# Patient Record
Sex: Female | Born: 1980 | Race: Black or African American | Hispanic: No | Marital: Married | Smoking: Never smoker
Health system: Southern US, Community
[De-identification: ages and names within clinical notes are randomized; demographics above are authoritative.]

## PROBLEM LIST (undated history)

## (undated) ENCOUNTER — Ambulatory Visit (HOSPITAL_COMMUNITY): Discharge status: Absent without leave | Payer: Commercial Managed Care - PPO

## (undated) DIAGNOSIS — O3429 Maternal care due to uterine scar from other previous surgery: Secondary | ICD-10-CM

## (undated) DIAGNOSIS — O09519 Supervision of elderly primigravida, unspecified trimester: Secondary | ICD-10-CM

## (undated) DIAGNOSIS — Z8619 Personal history of other infectious and parasitic diseases: Secondary | ICD-10-CM

## (undated) DIAGNOSIS — D219 Benign neoplasm of connective and other soft tissue, unspecified: Secondary | ICD-10-CM

## (undated) DIAGNOSIS — E669 Obesity, unspecified: Secondary | ICD-10-CM

## (undated) DIAGNOSIS — R87629 Unspecified abnormal cytological findings in specimens from vagina: Secondary | ICD-10-CM

## (undated) HISTORY — PX: MYOMECTOMY: SHX85

## (undated) HISTORY — PX: POLYPECTOMY: SHX149

## (undated) HISTORY — PX: LAPAROSCOPY: SHX197

## (undated) HISTORY — PX: TONSILLECTOMY: SUR1361

## (undated) HISTORY — PX: WISDOM TOOTH EXTRACTION: SHX21

## (undated) HISTORY — PX: CRYOTHERAPY: SHX1416

---

## 2015-10-13 LAB — OB RESULTS CONSOLE GC/CHLAMYDIA
CHLAMYDIA, DNA PROBE: NEGATIVE
Gonorrhea: NEGATIVE

## 2015-10-13 LAB — OB RESULTS CONSOLE ABO/RH: RH TYPE: POSITIVE

## 2015-10-13 LAB — OB RESULTS CONSOLE RPR: RPR: NONREACTIVE

## 2015-10-13 LAB — OB RESULTS CONSOLE ANTIBODY SCREEN: ANTIBODY SCREEN: NEGATIVE

## 2015-10-13 LAB — OB RESULTS CONSOLE HEPATITIS B SURFACE ANTIGEN: HEP B S AG: NEGATIVE

## 2015-10-13 LAB — OB RESULTS CONSOLE HIV ANTIBODY (ROUTINE TESTING): HIV: NONREACTIVE

## 2015-10-13 LAB — OB RESULTS CONSOLE RUBELLA ANTIBODY, IGM: Rubella: IMMUNE

## 2015-10-28 DIAGNOSIS — Z0289 Encounter for other administrative examinations: Secondary | ICD-10-CM

## 2016-03-07 ENCOUNTER — Other Ambulatory Visit: Payer: Self-pay | Admitting: Obstetrics and Gynecology

## 2016-04-07 ENCOUNTER — Encounter (HOSPITAL_COMMUNITY): Payer: Self-pay

## 2016-04-07 ENCOUNTER — Telehealth (HOSPITAL_COMMUNITY): Payer: Self-pay | Admitting: *Deleted

## 2016-04-07 NOTE — Telephone Encounter (Signed)
Preadmission screen  

## 2016-04-08 ENCOUNTER — Inpatient Hospital Stay (HOSPITAL_COMMUNITY)
Admission: AD | Admit: 2016-04-08 | Discharge: 2016-04-11 | DRG: 765 | Disposition: A | Discharge status: Home / Self Care | Payer: No Typology Code available for payment source | Source: Ambulatory Visit | Attending: Obstetrics and Gynecology | Admitting: Obstetrics and Gynecology

## 2016-04-08 ENCOUNTER — Encounter (HOSPITAL_COMMUNITY): Payer: Self-pay | Admitting: *Deleted

## 2016-04-08 DIAGNOSIS — O3429 Maternal care due to uterine scar from other previous surgery: Secondary | ICD-10-CM | POA: Diagnosis present

## 2016-04-08 DIAGNOSIS — O3493 Maternal care for abnormality of pelvic organ, unspecified, third trimester: Principal | ICD-10-CM | POA: Diagnosis present

## 2016-04-08 DIAGNOSIS — O9081 Anemia of the puerperium: Secondary | ICD-10-CM | POA: Diagnosis not present

## 2016-04-08 DIAGNOSIS — O3413 Maternal care for benign tumor of corpus uteri, third trimester: Secondary | ICD-10-CM | POA: Diagnosis present

## 2016-04-08 DIAGNOSIS — D250 Submucous leiomyoma of uterus: Secondary | ICD-10-CM | POA: Diagnosis present

## 2016-04-08 DIAGNOSIS — D62 Acute posthemorrhagic anemia: Secondary | ICD-10-CM | POA: Diagnosis not present

## 2016-04-08 DIAGNOSIS — Z3A36 36 weeks gestation of pregnancy: Secondary | ICD-10-CM

## 2016-04-08 HISTORY — DX: Maternal care due to uterine scar from other previous surgery: O34.29

## 2016-04-08 MED ORDER — CITRIC ACID-SODIUM CITRATE 334-500 MG/5ML PO SOLN
30.0000 mL | Freq: Once | ORAL | Status: AC
  Administered 2016-04-09: 30 mL via ORAL
  Filled 2016-04-08: qty 15

## 2016-04-08 MED ORDER — FAMOTIDINE IN NACL 20-0.9 MG/50ML-% IV SOLN
20.0000 mg | Freq: Once | INTRAVENOUS | Status: AC
  Administered 2016-04-09: 20 mg via INTRAVENOUS
  Filled 2016-04-08: qty 50

## 2016-04-08 MED ORDER — LACTATED RINGERS IV SOLN
INTRAVENOUS | Status: DC
  Administered 2016-04-09 (×3): via INTRAVENOUS

## 2016-04-08 MED ORDER — LACTATED RINGERS IV BOLUS (SEPSIS): 1000.0000 mL | Freq: Once | INTRAVENOUS | Status: DC

## 2016-04-08 NOTE — MAU Note (Signed)
Contractions denies SROM or bleeding

## 2016-04-08 NOTE — MAU Note (Signed)
PT   SAYS HURTING   SINCE 7PM.    THIS  PREG  IS  [place]  C/S ON 5-22    .   VE IN   OFFICE   CLOSED.

## 2016-04-09 ENCOUNTER — Inpatient Hospital Stay (HOSPITAL_COMMUNITY): Payer: No Typology Code available for payment source | Admitting: Anesthesiology

## 2016-04-09 ENCOUNTER — Encounter (HOSPITAL_COMMUNITY): Payer: Self-pay | Admitting: Obstetrics and Gynecology

## 2016-04-09 ENCOUNTER — Encounter (HOSPITAL_COMMUNITY)
Admission: AD | Disposition: A | Discharge status: Home / Self Care | Payer: Self-pay | Source: Ambulatory Visit | Attending: Obstetrics and Gynecology

## 2016-04-09 DIAGNOSIS — D250 Submucous leiomyoma of uterus: Secondary | ICD-10-CM | POA: Diagnosis present

## 2016-04-09 DIAGNOSIS — O3413 Maternal care for benign tumor of corpus uteri, third trimester: Secondary | ICD-10-CM | POA: Diagnosis present

## 2016-04-09 DIAGNOSIS — O3493 Maternal care for abnormality of pelvic organ, unspecified, third trimester: Secondary | ICD-10-CM | POA: Diagnosis present

## 2016-04-09 DIAGNOSIS — Z3A36 36 weeks gestation of pregnancy: Secondary | ICD-10-CM | POA: Diagnosis not present

## 2016-04-09 DIAGNOSIS — O9081 Anemia of the puerperium: Secondary | ICD-10-CM | POA: Diagnosis not present

## 2016-04-09 DIAGNOSIS — O3429 Maternal care due to uterine scar from other previous surgery: Secondary | ICD-10-CM

## 2016-04-09 DIAGNOSIS — D62 Acute posthemorrhagic anemia: Secondary | ICD-10-CM | POA: Diagnosis not present

## 2016-04-09 HISTORY — DX: Maternal care due to uterine scar from other previous surgery: O34.29

## 2016-04-09 LAB — CBC
HEMATOCRIT: 28.2 % — AB (ref 36.0–46.0)
HEMATOCRIT: 34.5 % — AB (ref 36.0–46.0)
HEMOGLOBIN: 11.9 g/dL — AB (ref 12.0–15.0)
Hemoglobin: 9.7 g/dL — ABNORMAL LOW (ref 12.0–15.0)
MCH: 32.1 pg (ref 26.0–34.0)
MCH: 32.1 pg (ref 26.0–34.0)
MCHC: 34.4 g/dL (ref 30.0–36.0)
MCHC: 34.5 g/dL (ref 30.0–36.0)
MCV: 93 fL (ref 78.0–100.0)
MCV: 93.4 fL (ref 78.0–100.0)
PLATELETS: 284 10*3/uL (ref 150–400)
Platelets: 345 10*3/uL (ref 150–400)
RBC: 3.02 MIL/uL — ABNORMAL LOW (ref 3.87–5.11)
RBC: 3.71 MIL/uL — ABNORMAL LOW (ref 3.87–5.11)
RDW: 15.1 % (ref 11.5–15.5)
RDW: 15.2 % (ref 11.5–15.5)
WBC: 11.8 10*3/uL — ABNORMAL HIGH (ref 4.0–10.5)
WBC: 15.5 10*3/uL — AB (ref 4.0–10.5)

## 2016-04-09 LAB — RPR: RPR Ser Ql: NONREACTIVE

## 2016-04-09 LAB — TYPE AND SCREEN
ABO/RH(D): O POS
Antibody Screen: NEGATIVE

## 2016-04-09 LAB — ABO/RH: ABO/RH(D): O POS

## 2016-04-09 SURGERY — Surgical Case
Anesthesia: Spinal

## 2016-04-09 MED ORDER — OXYTOCIN 10 UNIT/ML IJ SOLN
INTRAMUSCULAR | Status: AC
  Filled 2016-04-09: qty 4

## 2016-04-09 MED ORDER — PHENYLEPHRINE 40 MCG/ML (10ML) SYRINGE FOR IV PUSH (FOR BLOOD PRESSURE SUPPORT)
PREFILLED_SYRINGE | INTRAVENOUS | Status: AC
  Filled 2016-04-09: qty 10

## 2016-04-09 MED ORDER — METHYLERGONOVINE MALEATE 0.2 MG PO TABS: 0.2000 mg | ORAL_TABLET | ORAL | Status: DC | PRN

## 2016-04-09 MED ORDER — NALBUPHINE HCL 10 MG/ML IJ SOLN: 5.0000 mg | INTRAMUSCULAR | Status: DC | PRN

## 2016-04-09 MED ORDER — COCONUT OIL OIL
1.0000 "application " | TOPICAL_OIL | Status: DC | PRN
  Filled 2016-04-09: qty 120

## 2016-04-09 MED ORDER — ONDANSETRON HCL 4 MG/2ML IJ SOLN
INTRAMUSCULAR | Status: AC
  Filled 2016-04-09: qty 2

## 2016-04-09 MED ORDER — SODIUM CHLORIDE 0.9 % IV SOLN: 250.0000 mL | INTRAVENOUS | Status: DC

## 2016-04-09 MED ORDER — SODIUM CHLORIDE 0.9% FLUSH: 3.0000 mL | Freq: Two times a day (BID) | INTRAVENOUS | Status: DC

## 2016-04-09 MED ORDER — KETOROLAC TROMETHAMINE 30 MG/ML IJ SOLN
30.0000 mg | Freq: Four times a day (QID) | INTRAMUSCULAR | Status: DC | PRN
  Administered 2016-04-09: 30 mg via INTRAVENOUS

## 2016-04-09 MED ORDER — NALOXONE HCL 2 MG/2ML IJ SOSY
1.0000 ug/kg/h | PREFILLED_SYRINGE | INTRAMUSCULAR | Status: DC | PRN
Start: 2016-04-09 — End: 2016-04-10
  Filled 2016-04-09: qty 2

## 2016-04-09 MED ORDER — SODIUM CHLORIDE 0.9% FLUSH: 3.0000 mL | INTRAVENOUS | Status: DC | PRN

## 2016-04-09 MED ORDER — LACTATED RINGERS IV SOLN
INTRAVENOUS | Status: DC
  Administered 2016-04-09: 12:00:00 via INTRAVENOUS

## 2016-04-09 MED ORDER — ONDANSETRON HCL 4 MG/2ML IJ SOLN: 4.0000 mg | Freq: Three times a day (TID) | INTRAMUSCULAR | Status: DC | PRN

## 2016-04-09 MED ORDER — OXYTOCIN 10 UNIT/ML IJ SOLN
40.0000 [IU] | INTRAMUSCULAR | Status: DC | PRN
  Administered 2016-04-09: 40 [IU] via INTRAVENOUS

## 2016-04-09 MED ORDER — MORPHINE SULFATE (PF) 0.5 MG/ML IJ SOLN
INTRAMUSCULAR | Status: AC
  Filled 2016-04-09: qty 10

## 2016-04-09 MED ORDER — ZOLPIDEM TARTRATE 5 MG PO TABS: 5.0000 mg | ORAL_TABLET | Freq: Every evening | ORAL | Status: DC | PRN

## 2016-04-09 MED ORDER — BUPIVACAINE HCL (PF) 0.25 % IJ SOLN
INTRAMUSCULAR | Status: DC | PRN
  Administered 2016-04-09: 30 mL

## 2016-04-09 MED ORDER — DIBUCAINE 1 % RE OINT
1.0000 "application " | TOPICAL_OINTMENT | RECTAL | Status: DC | PRN
  Filled 2016-04-09: qty 56.7

## 2016-04-09 MED ORDER — OXYCODONE HCL 5 MG PO TABS: 10.0000 mg | ORAL_TABLET | ORAL | Status: DC | PRN

## 2016-04-09 MED ORDER — MEPERIDINE HCL 25 MG/ML IJ SOLN
INTRAMUSCULAR | Status: AC
Start: 2016-04-09 — End: 2016-04-09
  Filled 2016-04-09: qty 1

## 2016-04-09 MED ORDER — FENTANYL CITRATE (PF) 100 MCG/2ML IJ SOLN: 25.0000 ug | INTRAMUSCULAR | Status: DC | PRN

## 2016-04-09 MED ORDER — NALBUPHINE HCL 10 MG/ML IJ SOLN: 5.0000 mg | Freq: Once | INTRAMUSCULAR | Status: DC | PRN

## 2016-04-09 MED ORDER — LACTATED RINGERS IV SOLN: INTRAVENOUS | Status: DC

## 2016-04-09 MED ORDER — DIPHENHYDRAMINE HCL 50 MG/ML IJ SOLN
INTRAMUSCULAR | Status: DC | PRN
  Administered 2016-04-09: 25 mg via INTRAVENOUS

## 2016-04-09 MED ORDER — METOCLOPRAMIDE HCL 5 MG/ML IJ SOLN
INTRAMUSCULAR | Status: AC
  Administered 2016-04-09: 10 mg via INTRAVENOUS
  Filled 2016-04-09: qty 2

## 2016-04-09 MED ORDER — OXYCODONE HCL 5 MG PO TABS
5.0000 mg | ORAL_TABLET | ORAL | Status: DC | PRN
  Administered 2016-04-10 (×3): 5 mg via ORAL
  Filled 2016-04-09 (×3): qty 1

## 2016-04-09 MED ORDER — CEFAZOLIN SODIUM-DEXTROSE 2-4 GM/100ML-% IV SOLN
2.0000 g | INTRAVENOUS | Status: AC
  Administered 2016-04-09: 2 g via INTRAVENOUS
  Filled 2016-04-09: qty 100

## 2016-04-09 MED ORDER — FERROUS SULFATE 325 (65 FE) MG PO TABS
325.0000 mg | ORAL_TABLET | Freq: Two times a day (BID) | ORAL | Status: DC
  Administered 2016-04-09 – 2016-04-11 (×5): 325 mg via ORAL
  Filled 2016-04-09 (×6): qty 1

## 2016-04-09 MED ORDER — PHENYLEPHRINE 8 MG IN D5W 100 ML (0.08MG/ML) PREMIX OPTIME
INJECTION | INTRAVENOUS | Status: AC
  Filled 2016-04-09: qty 100

## 2016-04-09 MED ORDER — KETOROLAC TROMETHAMINE 30 MG/ML IJ SOLN
INTRAMUSCULAR | Status: AC
  Filled 2016-04-09: qty 1

## 2016-04-09 MED ORDER — KETOROLAC TROMETHAMINE 30 MG/ML IJ SOLN: 30.0000 mg | Freq: Four times a day (QID) | INTRAMUSCULAR | Status: DC | PRN

## 2016-04-09 MED ORDER — SCOPOLAMINE 1 MG/3DAYS TD PT72
MEDICATED_PATCH | TRANSDERMAL | Status: DC | PRN
  Administered 2016-04-09: 1 via TRANSDERMAL

## 2016-04-09 MED ORDER — MEPERIDINE HCL 25 MG/ML IJ SOLN
INTRAMUSCULAR | Status: DC | PRN
  Administered 2016-04-09 (×2): 12.5 mg via INTRAVENOUS

## 2016-04-09 MED ORDER — NALOXONE HCL 0.4 MG/ML IJ SOLN: 0.4000 mg | INTRAMUSCULAR | Status: DC | PRN

## 2016-04-09 MED ORDER — DIPHENHYDRAMINE HCL 25 MG PO CAPS
25.0000 mg | ORAL_CAPSULE | ORAL | Status: DC | PRN
  Filled 2016-04-09: qty 1

## 2016-04-09 MED ORDER — METOCLOPRAMIDE HCL 5 MG/ML IJ SOLN: 10.0000 mg | Freq: Once | INTRAMUSCULAR | Status: DC | PRN

## 2016-04-09 MED ORDER — SIMETHICONE 80 MG PO CHEW
80.0000 mg | CHEWABLE_TABLET | ORAL | Status: DC | PRN
  Filled 2016-04-09: qty 1

## 2016-04-09 MED ORDER — SIMETHICONE 80 MG PO CHEW
80.0000 mg | CHEWABLE_TABLET | ORAL | Status: DC
  Administered 2016-04-10 (×2): 80 mg via ORAL
  Filled 2016-04-09 (×2): qty 1

## 2016-04-09 MED ORDER — MEPERIDINE HCL 25 MG/ML IJ SOLN: 6.2500 mg | INTRAMUSCULAR | Status: DC | PRN

## 2016-04-09 MED ORDER — MORPHINE SULFATE (PF) 0.5 MG/ML IJ SOLN
INTRAMUSCULAR | Status: DC | PRN
  Administered 2016-04-09: .1 mg via INTRATHECAL

## 2016-04-09 MED ORDER — PHENYLEPHRINE 8 MG IN D5W 100 ML (0.08MG/ML) PREMIX OPTIME
INJECTION | INTRAVENOUS | Status: DC | PRN
  Administered 2016-04-09: 40 ug/min via INTRAVENOUS

## 2016-04-09 MED ORDER — BUPIVACAINE IN DEXTROSE 0.75-8.25 % IT SOLN
INTRATHECAL | Status: DC | PRN
  Administered 2016-04-09: 1.4 mL via INTRATHECAL

## 2016-04-09 MED ORDER — SCOPOLAMINE 1 MG/3DAYS TD PT72
MEDICATED_PATCH | TRANSDERMAL | Status: AC
  Filled 2016-04-09: qty 1

## 2016-04-09 MED ORDER — METHYLERGONOVINE MALEATE 0.2 MG/ML IJ SOLN: 0.2000 mg | INTRAMUSCULAR | Status: DC | PRN

## 2016-04-09 MED ORDER — BISACODYL 10 MG RE SUPP
10.0000 mg | Freq: Every day | RECTAL | Status: DC | PRN
Start: 2016-04-09 — End: 2016-04-11
  Filled 2016-04-09: qty 1

## 2016-04-09 MED ORDER — ONDANSETRON HCL 4 MG/2ML IJ SOLN
INTRAMUSCULAR | Status: DC | PRN
  Administered 2016-04-09: 4 mg via INTRAVENOUS

## 2016-04-09 MED ORDER — SCOPOLAMINE 1 MG/3DAYS TD PT72: 1.0000 | MEDICATED_PATCH | Freq: Once | TRANSDERMAL | Status: DC

## 2016-04-09 MED ORDER — OXYTOCIN 40 UNITS IN LACTATED RINGERS INFUSION - SIMPLE MED: 2.5000 [IU]/h | INTRAVENOUS | Status: AC

## 2016-04-09 MED ORDER — TRIAMCINOLONE ACETONIDE 40 MG/ML IJ SUSP: 40.0000 mg | Freq: Once | INTRAMUSCULAR | Status: DC

## 2016-04-09 MED ORDER — FLEET ENEMA 7-19 GM/118ML RE ENEM: 1.0000 | ENEMA | Freq: Every day | RECTAL | Status: DC | PRN

## 2016-04-09 MED ORDER — SIMETHICONE 80 MG PO CHEW
80.0000 mg | CHEWABLE_TABLET | Freq: Three times a day (TID) | ORAL | Status: DC
  Administered 2016-04-09 – 2016-04-11 (×8): 80 mg via ORAL
  Filled 2016-04-09 (×10): qty 1

## 2016-04-09 MED ORDER — SILVER NITRATE-POT NITRATE 75-25 % EX MISC
CUTANEOUS | Status: AC
  Filled 2016-04-09: qty 1

## 2016-04-09 MED ORDER — TRIAMCINOLONE ACETONIDE 40 MG/ML IJ SUSP
Freq: Once | INTRAMUSCULAR | Status: DC
  Filled 2016-04-09 (×2): qty 1

## 2016-04-09 MED ORDER — PRENATAL MULTIVITAMIN CH
1.0000 | ORAL_TABLET | Freq: Every day | ORAL | Status: DC
  Administered 2016-04-09 – 2016-04-11 (×3): 1 via ORAL
  Filled 2016-04-09 (×4): qty 1

## 2016-04-09 MED ORDER — PHENYLEPHRINE HCL 10 MG/ML IJ SOLN
INTRAMUSCULAR | Status: DC | PRN
  Administered 2016-04-09 (×4): 80 ug via INTRAVENOUS

## 2016-04-09 MED ORDER — DIPHENHYDRAMINE HCL 50 MG/ML IJ SOLN: 12.5000 mg | INTRAMUSCULAR | Status: DC | PRN

## 2016-04-09 MED ORDER — DEXAMETHASONE SODIUM PHOSPHATE 4 MG/ML IJ SOLN
INTRAMUSCULAR | Status: AC
  Filled 2016-04-09: qty 1

## 2016-04-09 MED ORDER — DIPHENHYDRAMINE HCL 25 MG PO CAPS
25.0000 mg | ORAL_CAPSULE | Freq: Four times a day (QID) | ORAL | Status: DC | PRN
  Filled 2016-04-09: qty 1

## 2016-04-09 MED ORDER — WITCH HAZEL-GLYCERIN EX PADS: 1.0000 "application " | MEDICATED_PAD | CUTANEOUS | Status: DC | PRN

## 2016-04-09 MED ORDER — SENNOSIDES-DOCUSATE SODIUM 8.6-50 MG PO TABS
2.0000 | ORAL_TABLET | ORAL | Status: DC
  Administered 2016-04-10 (×2): 2 via ORAL
  Filled 2016-04-09 (×2): qty 2

## 2016-04-09 MED ORDER — FENTANYL CITRATE (PF) 100 MCG/2ML IJ SOLN
INTRAMUSCULAR | Status: DC | PRN
  Administered 2016-04-09: 12.5 ug via INTRATHECAL
  Administered 2016-04-09: 40 ug via INTRAVENOUS

## 2016-04-09 MED ORDER — FENTANYL CITRATE (PF) 100 MCG/2ML IJ SOLN
INTRAMUSCULAR | Status: AC
  Filled 2016-04-09: qty 2

## 2016-04-09 MED ORDER — MENTHOL 3 MG MT LOZG
1.0000 | LOZENGE | OROMUCOSAL | Status: DC | PRN
  Filled 2016-04-09: qty 9

## 2016-04-09 MED ORDER — IBUPROFEN 600 MG PO TABS
600.0000 mg | ORAL_TABLET | Freq: Four times a day (QID) | ORAL | Status: DC
  Administered 2016-04-09 – 2016-04-11 (×9): 600 mg via ORAL
  Filled 2016-04-09 (×9): qty 1

## 2016-04-09 SURGICAL SUPPLY — 43 items
BARRIER ADHS 3X4 INTERCEED (GAUZE/BANDAGES/DRESSINGS) ×3 IMPLANT
BENZOIN TINCTURE PRP APPL 2/3 (GAUZE/BANDAGES/DRESSINGS) IMPLANT
CLAMP CORD UMBIL (MISCELLANEOUS) IMPLANT
CLOSURE WOUND 1/2 X4 (GAUZE/BANDAGES/DRESSINGS)
CLOTH BEACON ORANGE TIMEOUT ST (SAFETY) ×3 IMPLANT
CONTAINER PREFILL 10% NBF 15ML (MISCELLANEOUS) IMPLANT
DRAPE C SECTION CLR SCREEN (DRAPES) ×3 IMPLANT
DRSG OPSITE POSTOP 4X10 (GAUZE/BANDAGES/DRESSINGS) ×3 IMPLANT
DURAPREP 26ML APPLICATOR (WOUND CARE) ×3 IMPLANT
ELECT REM PT RETURN 9FT ADLT (ELECTROSURGICAL) ×3
ELECTRODE REM PT RTRN 9FT ADLT (ELECTROSURGICAL) ×1 IMPLANT
EXTRACTOR VACUUM M CUP 4 TUBE (SUCTIONS) IMPLANT
EXTRACTOR VACUUM M CUP 4' TUBE (SUCTIONS)
GLOVE BIOGEL PI IND STRL 7.0 (GLOVE) ×2 IMPLANT
GLOVE BIOGEL PI INDICATOR 7.0 (GLOVE) ×4
GLOVE ECLIPSE 6.5 STRL STRAW (GLOVE) ×3 IMPLANT
GOWN STRL REUS W/TWL LRG LVL3 (GOWN DISPOSABLE) ×6 IMPLANT
KIT ABG SYR 3ML LUER SLIP (SYRINGE) IMPLANT
NEEDLE HYPO 22GX1.5 SAFETY (NEEDLE) ×3 IMPLANT
NEEDLE HYPO 25X5/8 SAFETYGLIDE (NEEDLE) IMPLANT
NS IRRIG 1000ML POUR BTL (IV SOLUTION) ×3 IMPLANT
PACK C SECTION WH (CUSTOM PROCEDURE TRAY) ×3 IMPLANT
PAD OB MATERNITY 4.3X12.25 (PERSONAL CARE ITEMS) ×3 IMPLANT
RTRCTR C-SECT PINK 25CM LRG (MISCELLANEOUS) IMPLANT
STRIP CLOSURE SKIN 1/2X4 (GAUZE/BANDAGES/DRESSINGS) IMPLANT
SUT CHROMIC GUT AB #0 18 (SUTURE) IMPLANT
SUT MNCRL 0 VIOLET CTX 36 (SUTURE) ×3 IMPLANT
SUT MON AB 2-0 SH 27 (SUTURE)
SUT MON AB 2-0 SH27 (SUTURE) IMPLANT
SUT MON AB 3-0 SH 27 (SUTURE)
SUT MON AB 3-0 SH27 (SUTURE) IMPLANT
SUT MON AB 4-0 PS1 27 (SUTURE) IMPLANT
SUT MONOCRYL 0 CTX 36 (SUTURE) ×6
SUT PLAIN 2 0 (SUTURE)
SUT PLAIN 2 0 XLH (SUTURE) IMPLANT
SUT PLAIN ABS 2-0 CT1 27XMFL (SUTURE) IMPLANT
SUT VIC AB 0 CT1 36 (SUTURE) ×6 IMPLANT
SUT VIC AB 2-0 CT1 27 (SUTURE) ×2
SUT VIC AB 2-0 CT1 TAPERPNT 27 (SUTURE) ×1 IMPLANT
SUT VIC AB 4-0 PS2 27 (SUTURE) IMPLANT
SYR CONTROL 10ML LL (SYRINGE) ×3 IMPLANT
TOWEL OR 17X24 6PK STRL BLUE (TOWEL DISPOSABLE) ×3 IMPLANT
TRAY FOLEY CATH SILVER 14FR (SET/KITS/TRAYS/PACK) IMPLANT

## 2016-04-09 NOTE — Op Note (Signed)
[name], [name]               ACCOUNT NO.:  [id number]  MEDICAL RECORD NO.:  [id number]  LOCATION:  [facility]                          FACILITY:  [facility]  PHYSICIAN:  Servando Salina, M.D.DATE OF BIRTH:  [birth year]  DATE OF PROCEDURE:  04/09/2016 DATE OF DISCHARGE:                              OPERATIVE REPORT   PREOPERATIVE DIAGNOSES:  Labor, previous myomectomy, intrauterine gestation at 36-1/7th weeks.  PROCEDURES:  Primary cesarean section, Kerr hysterotomy, myomectomy.  POSTOPERATIVE DIAGNOSES:  Labor, previous myomectomy, intrauterine gestation at 36-1/7th weeks, submucosal fibroid, fibroid uterus.  ANESTHESIA:  Spinal.  SURGEON:  Servando Salina, M.D.  ASSISTANT:  Hester Mates, CNM  DESCRIPTION OF PROCEDURE:  Under adequate spinal anesthesia, the patient was placed in the supine position with a left lateral tilt.  She was sterilely prepped and draped in usual fashion.  Indwelling Foley catheter was sterilely placed.  Marcaine 0.25% was injected along the previous Pfannenstiel skin incision.  The patient had a keloid formation in the midportion of her prior incision.  A Pfannenstiel skin incision was then made through the previous scar, carried down to the rectus fascia.  Rectus fascia was opened transversely.  The rectus fascia was then bluntly and sharply dissected off the rectus muscle in a superior and inferior fashion.  The rectus muscle was split in the midline.  The parietal peritoneum was entered sharply and extended.  The fibroid uterus was encountered.  Well-developed lower uterine segment was encountered.  A self-retaining Alexis retractor was then placed.  A vesicouterine peritoneum was opened transversely and extended bilaterally.  Bladder was gently dissected off the lower uterine segment.  Curvilinear low transverse uterine incision was then made and extended with bandage scissors.  Artificial rupture of membranes then was done, copious fluid was  noted, and the baby's right shoulder was coming to an incision, both of those items were reduced.  Subsequent delivery of a live female from the left occiput anterior position wedged in the pelvis was accomplished.  Cord around the neck x2 was noted which was reducible.  Baby was bulb suctioned on the abdomen.  Cord was clamped, cut.  The baby was transferred to the awaiting pediatrician who assigned Apgars 8 and 9 at 1 and 5 minutes.  The placenta was posterior and fundal.  The manual removal was accomplished with some evidence of may be placental separation being present.  The cavity was then cleaned of debris.  On manual exploration of the cavity and visual exploration of the cavity, posterior submucosal fibroid was noted and was removed.  A left fundal submucosal fibroid in the cornual region was palpated, but not removed due to its location.  Uterine cavity was cleaned of debris.  Brisk bleeding was noted from vessels on the left angle of the incision which was then clamped.  The incision had a small extension on the right inferior aspect, which was closed separately with 0 Monocryl running locked stitch, imbricated with some 0 Monocryl interrupted sutures.  The incision was closed with 0 Monocryl running locked stitch first layer.  There was bleeding and that corner required about 2 additional figure-of-eight sutures for hemostasis and then the second layer was imbricated with 0 Monocryl  suture was then performed. Good hemostasis therefore at that point achieved.  Clear urine was noted.  The left tube has some paratubal cysts which are not removed. Both ovaries were normal.  The right tube was normal.  There was evidence of a left fundal fibroid which was the larger one. Other intramural anterior, small pedunculated fibroids and serosal fibroids were noted.  The abdomen was irrigated and suctioned of debris.  The incision had good hemostasis.  Interceed was placed overlying the  incision in an inverted T fashion.  The Alexis retractor was then removed.  The parietal peritoneum was then closed with 2-0 Vicryl.  The rectus fascia was closed with 0 Vicryl x2.  The subcutaneous area was irrigated, small bleeders cauterized.  Kenalog with 0.5% Marcaine was then injected along that incision and the keloid area that had been severed in half was removed anteriorly and posterior for the incision.  Once that was done, the skin was approximated using 4-0 Vicryl subcuticular closure after 2- 0 plain sutures were placed in the subcutaneous area.  Dermabond was placed overlying the incision.  SPECIMEN:  Placenta and myoma sent to Pathology.  ESTIMATED BLOOD LOSS:  1500 ml.  URINE OUTPUT:  75 mL clear yellow urine.  INTRAOPERATIVE FLUID:  3 L.  COUNTS:  Sponge and instrument counts x2 were correct.  COMPLICATIONS:  None.  The patient tolerated the procedure well, was transferred to recovery in stable condition. Baby placed skin to skin.     Servando Salina, M.D.     Poth/MEDQ  D:  04/09/2016  T:  04/09/2016  Job:  [id number]

## 2016-04-09 NOTE — Anesthesia Preprocedure Evaluation (Signed)
Anesthesia Evaluation  Patient identified by MRN, date of birth, ID band Patient awake    Reviewed: Allergy & Precautions, H&P , NPO status , Patient's Chart, lab work & pertinent test results  History of Anesthesia Complications Negative for: history of anesthetic complications  Airway Mallampati: II  TM Distance: >3 FB Neck ROM: full    Dental no notable dental hx. (+) Teeth Intact   Pulmonary neg pulmonary ROS,    Pulmonary exam normal breath sounds clear to auscultation       Cardiovascular negative cardio ROS Normal cardiovascular exam Rhythm:regular Rate:Normal     Neuro/Psych negative neurological ROS  negative psych ROS   GI/Hepatic negative GI ROS, Neg liver ROS,   Endo/Other  negative endocrine ROS  Renal/GU negative Renal ROS  negative genitourinary   Musculoskeletal   Abdominal   Peds  Hematology negative hematology ROS (+)   Anesthesia Other Findings   Reproductive/Obstetrics (+) Pregnancy                             Anesthesia Physical Anesthesia Plan  ASA: II and emergent  Anesthesia Plan: Spinal   Post-op Pain Management:    Induction:   Airway Management Planned:   Additional Equipment:   Intra-op Plan:   Post-operative Plan:   Informed Consent: I have reviewed the patients History and Physical, chart, labs and discussed the procedure including the risks, benefits and alternatives for the proposed anesthesia with the patient or authorized representative who has indicated his/her understanding and acceptance.     Plan Discussed with:   Anesthesia Plan Comments:         Anesthesia Quick Evaluation

## 2016-04-09 NOTE — H&P (Signed)
[name] is a 35 y.o. female [id number] MBF  Previous myomectomy now @ 36 1/[redacted] weeks gestation  presenting in labor. Pt is scheduled for primary C/S @ 36 weeks. Intact membrane . (+) ctx. GBS cx done Friday. Seen in office Friday and was 1 cm/long/-3. NST was reactive with rare ctx  Maternal Medical History:  Reason for admission: Contractions.   Contractions: Onset was 3-5 hours ago.   Frequency: regular.   Perceived severity is moderate.    Fetal activity: Perceived fetal activity is normal.    Prenatal complications: Uterine fibroids  Prenatal Complications - Diabetes: none.    OB History    Gravida Para Term Preterm AB TAB SAB Ectopic Multiple Living   7 1   5  5         Past Medical History  Diagnosis Date  . Fibroid   . AMA (advanced maternal age) primigravida 14+   . Hx of varicella   . Obese    Past Surgical History  Procedure Laterality Date  . Myomectomy    . Polypectomy    . Tonsillectomy    . Cryotherapy    . Wisdom tooth extraction    . Laparoscopy     Family History: family history includes Cancer in her maternal grandfather and paternal grandfather; Crohn's disease in her brother; Diabetes in her maternal grandmother; GER disease in her mother; Hypertension in her brother, father, and mother; Uterine cancer in her paternal grandmother. Social History:  reports that she has never smoked. She does not have any smokeless tobacco history on file. She reports that she does not drink alcohol or use illicit drugs.   Prenatal Transfer Tool  Maternal Diabetes: No Genetic Screening: Declined Maternal Ultrasounds/Referrals: Abnormal:  Findings:   Isolated EIF (echogenic intracardiac focus) Fetal Ultrasounds or other Referrals:  None Maternal Substance Abuse:  No Significant Maternal Medications:  None Significant Maternal Lab Results:  Lab values include: Other: GBS cx pending Other Comments:  hx recurrent preg loss, fibroid uterus with prior myomectomy  Review  of Systems  All other systems reviewed and are negative.   Dilation: 3 Effacement (%): 90 Station: -2 Exam by:: [id number] Blood pressure 129/78, pulse 80, temperature 97.9 F (36.6 C), temperature source Oral, resp. rate 20, height 5\' 5"  (1.651 m), weight 92.534 kg (204 lb), last menstrual period 08/05/2015. Exam Physical Exam  Constitutional: She is oriented to person, place, and time. She appears well-developed and well-nourished.  HENT:  Head: Normocephalic.  Eyes: EOM are normal.  Neck: Normal range of motion.  Cardiovascular: Normal rate.   Respiratory: Effort normal.  GI: Soft.  Musculoskeletal: She exhibits edema.  Neurological: She is alert and oriented to person, place, and time.  Skin: Skin is warm and dry.  Psychiatric: She has a normal mood and affect.    Prenatal labs: ABO, Rh: O/Positive/-- (11/16 0000) Antibody: Negative (11/16 0000) Rubella: Immune (11/16 0000) RPR: Nonreactive (11/16 0000)  HBsAg: Negative (11/16 0000)  HIV: Non-reactive (11/16 0000)  GBS:   pending  CBC    Component Value Date/Time   WBC 11.8* 04/09/2016 2354   RBC 3.71* 04/09/2016 2354   HGB 11.9* 04/09/2016 2354   HCT 34.5* 04/09/2016 2354   PLT 345 04/09/2016 2354   MCV 93.0 04/09/2016 2354   MCH 32.1 04/09/2016 2354   MCHC 34.5 04/09/2016 2354   RDW 15.2 04/09/2016 2354     Assessment/Plan: Labor Previous myomectomy Fibroid uterus IUP @ 36 1/7 wks P) admit routine lab. Primary  C/S.  Risk of C/S reviewed including infection, bleeding, possible need for blood transfusion and its risk (HIV, hepatitis, acute rxn), internal scar tissue, injury to surrounding organs( ureter, bladder, bladder). All ? Answered. Due to risk of uterine rupture, pt cannot wait for 8 hours post last meal.  Gamble Enderle A 04/09/2016, 12:09 AM

## 2016-04-09 NOTE — Addendum Note (Signed)
Addendum  created 04/09/16 0757 by Ignacia Bayley, CRNA   Modules edited: Clinical Notes   Clinical Notes:  File: [id number]

## 2016-04-09 NOTE — Anesthesia Postprocedure Evaluation (Signed)
Anesthesia Post Note  Patient: [name]  Procedure(s) Performed: Procedure(s) (LRB): CESAREAN SECTION (N/A)  Patient location during evaluation: Mother Baby Anesthesia Type: Spinal Level of consciousness: awake Pain management: pain level controlled Vital Signs Assessment: post-procedure vital signs reviewed and stable Respiratory status: spontaneous breathing Cardiovascular status: stable Postop Assessment: no headache, no backache, spinal receding, patient able to bend at knees, no signs of nausea or vomiting and adequate PO intake Anesthetic complications: no     Last Vitals:  Filed Vitals:   04/09/16 0516 04/09/16 0628  BP: 109/70 112/67  Pulse: 79 70  Temp: 37.3 C 37 C  Resp: 18 18    Last Pain:  Filed Vitals:   04/09/16 0628  PainSc: 3    Pain Goal:                 [name]

## 2016-04-09 NOTE — Anesthesia Postprocedure Evaluation (Signed)
Anesthesia Post Note  Patient: [name]  Procedure(s) Performed: Procedure(s) (LRB): CESAREAN SECTION (N/A)  Patient location during evaluation: PACU Anesthesia Type: Spinal Level of consciousness: oriented and awake and alert Pain management: pain level controlled Vital Signs Assessment: post-procedure vital signs reviewed and stable Respiratory status: spontaneous breathing, respiratory function stable and patient connected to nasal cannula oxygen Cardiovascular status: blood pressure returned to baseline and stable Postop Assessment: no headache and no backache Anesthetic complications: no     Last Vitals:  Filed Vitals:   04/09/16 0430 04/09/16 0516  BP: 114/63 109/70  Pulse: 78 79  Temp: 36.8 C 37.3 C  Resp: 18 18    Last Pain:  Filed Vitals:   04/09/16 0516  PainSc: 3    Pain Goal:                 Montez Hageman

## 2016-04-09 NOTE — Brief Op Note (Signed)
04/08/2016 - 04/09/2016  2:32 AM  PATIENT:  [name]  35 y.o. female  PRE-OPERATIVE DIAGNOSIS:  Labor, previous myomectomy, IUP@36  1/7 weeks  POST-OPERATIVE DIAGNOSIS:  Labor, previous myomectomy, IUP@ 36 1/7 weeks, SM fibroid, Fibroid uterus  PROCEDURE:  Primary Cesarean, kerr hysterotomy. myomectomy  SURGEON:  Surgeon(s) and Role:    * Corporate treasurer, MD - Primary  PHYSICIAN ASSISTANT:   ASSISTANTS: Laury Deep, CNM   ANESTHESIA:   spinal Findings: fibroids( SM fibroid left fundal, IM fibroid, SS fibroid, nl tubes and ovaries, left small paratubal, small SM posterior  fibroid, live female LOA wedged. Apgar 8/9. Nl tubes and ovaries,  Nuchal cord x 2 placenta post ? abruption EBL:  Total I/O In: 3000 [I.V.:3000] Out: 1625 [Urine:75; Blood:1550]  BLOOD ADMINISTERED:none  DRAINS: none   LOCAL MEDICATIONS USED:  MARCAINE     SPECIMEN:  Source of Specimen:  placenta, myoma  DISPOSITION OF SPECIMEN:  PATHOLOGY  COUNTS:  YES  TOURNIQUET:  * No tourniquets in log *  DICTATION: .Dragon [phone]  PLAN OF CARE: Admit to inpatient   PATIENT DISPOSITION:  PACU - hemodynamically stable.   Delay start of Pharmacological VTE agent (>24hrs) due to surgical blood loss or risk of bleeding: no

## 2016-04-09 NOTE — OR Nursing (Signed)
Unable to document PACU times in events. In recovery- 0221 Recovery Care Complete- 0345 Out of Recovery -0345 Procedural Care Complete- 0345

## 2016-04-09 NOTE — Transfer of Care (Signed)
Immediate Anesthesia Transfer of Care Note  Patient: [name]  Procedure(s) Performed: Procedure(s): CESAREAN SECTION (N/A)  Patient Location: PACU  Anesthesia Type:Spinal  Level of Consciousness: awake, alert  and oriented  Airway & Oxygen Therapy: Patient Spontanous Breathing  Post-op Assessment: Report given to RN and Post -op Vital signs reviewed and stable  Post vital signs: Reviewed and stable  Last Vitals:  Filed Vitals:   04/08/16 2329  BP: 129/78  Pulse: 80  Temp: 36.6 C  Resp: 20    Last Pain: There were no vitals filed for this visit.       Complications: No apparent anesthesia complications

## 2016-04-09 NOTE — Lactation Note (Signed)
This note was copied from a baby's chart. Lactation Consultation Note  Patient Name: Boy Margart Jackson [id number] Date: 04/09/2016 Reason for consult: Initial assessment  With this mom of a LPI, now 35 hours old, and 36 1/[redacted] weeks gestation, weighing 4 lbs 10 oz at birth. LIP policy reviewed with mom, side lying and paced feeding. Elijah fed well, 9 ml's of formula, and 0.2 ml's EBM, but it took him about 20 minutes to get coordinated and begin sucking. Mom pumped in initiation setting, and was shown how to hand express, and expressed about 0.2 - 0.3 ml's colostrum, which Elijah fed prior to formula, some by finger, some from bottle nipple. Mom doing well, and may need some review of information. She knows to feed at least every 3 hours, and more if he shows hunger cues. Mom has a [personal DEP at home. Lactation services reivewed with mom, as well as some Baby and Me bookd breastfeeding teaching. Mom knows to call for questions/concerns.    Maternal Data Formula Feeding for Exclusion: Yes ( needs formula for supplement) Has patient been taught Hand Expression?: Yes Does the patient have breastfeeding experience prior to this delivery?: Yes  Feeding Feeding Type: Bottle Fed - Formula Nipple Type: Slow - flow Length of feed: 15 min  LATCH Score/Interventions                      Lactation Tools Discussed/Used Tools: Bottle Pump Review: Setup, frequency, and cleaning;Milk Storage;Other (comment) (initiation setting, hand expression) Initiated by:: bedside nurse Date initiated:: 04/09/16   Consult Status Consult Status: Follow-up Date: 04/10/16 Follow-up type: In-patient    Tonna Corner 04/09/2016, 4:34 PM

## 2016-04-09 NOTE — MAU Note (Signed)
IN OR 

## 2016-04-09 NOTE — Anesthesia Procedure Notes (Signed)
Spinal Patient location during procedure: OR Staffing Anesthesiologist: Vega Stare Performed by: anesthesiologist  Preanesthetic Checklist Completed: patient identified, site marked, surgical consent, pre-op evaluation, timeout performed, IV checked, risks and benefits discussed and monitors and equipment checked Spinal Block Patient position: sitting Prep: ChloraPrep Patient monitoring: heart rate, continuous pulse ox and blood pressure Approach: midline Location: L4-5 Injection technique: single-shot Needle Needle type: Sprotte  Needle gauge: 24 G Needle length: 9 cm Additional Notes Expiration date of kit checked and confirmed. Patient tolerated procedure well, without complications.     

## 2016-04-09 NOTE — Progress Notes (Signed)
Subjective: Postpartum Day 0,  Cesarean Delivery this morning. No pain, feels well. Happy. Baby has latched but no colostrum and she was advised to give him formula as needed since preterm birth  Objective: Vital signs in last 24 hours: Temp:  [97.9 F (36.6 C)-99.4 F (37.4 C)] 99.4 F (37.4 C) (05/14 1145) Pulse Rate:  [67-90] 89 (05/14 1149) Resp:  [15-20] 15 (05/14 1145) BP: (102-129)/(55-86) 107/61 mmHg (05/14 1149) SpO2:  [95 %-99 %] 97 % (05/14 1145) Weight:  [204 lb (92.534 kg)] 204 lb (92.534 kg) (05/13 2329)  Physical Exam:  General: alert and cooperative Lochia: appropriate Uterine Fundus: firm Incision: no significant drainage DVT Evaluation: No evidence of DVT seen on physical exam. Negative Homan's sign.   Recent Labs  04/09/16 0326 04/09/16 2354  HGB 9.7* 11.9*  HCT 28.2* 34.5*    Assessment/Plan: Status post Cesarean section. Doing well postoperatively.  Continue current care.  Ellin Fitzgibbons R 04/09/2016, 12:51 PM

## 2016-04-10 ENCOUNTER — Encounter (HOSPITAL_COMMUNITY): Payer: Self-pay | Admitting: Obstetrics and Gynecology

## 2016-04-10 LAB — CBC
HCT: 24.4 % — ABNORMAL LOW (ref 36.0–46.0)
Hemoglobin: 8.3 g/dL — ABNORMAL LOW (ref 12.0–15.0)
MCH: 31.9 pg (ref 26.0–34.0)
MCHC: 34 g/dL (ref 30.0–36.0)
MCV: 93.8 fL (ref 78.0–100.0)
PLATELETS: 257 10*3/uL (ref 150–400)
RBC: 2.6 MIL/uL — ABNORMAL LOW (ref 3.87–5.11)
RDW: 15.6 % — AB (ref 11.5–15.5)
WBC: 14.3 10*3/uL — AB (ref 4.0–10.5)

## 2016-04-10 NOTE — Lactation Note (Addendum)
This note was copied from a baby's chart. Lactation Consultation Note  Baby 82 hours old and breastfeeding in cradle position upon entering room. A few sucks observed with stimulation.  Recommend mother limit time hanging out at the breast. Plan is for mother to breastfeed up to 25 min.  Then have FOB give supplement while mother pumps. Reminded mother to post pump at least 4-5x a day for 10-15 min and give volume back at next feeding. Discussed LPI feeding behavior and suggest parents call if they need further assistance. Mother states she has DEBP at home.  Reminded parents to feed baby q3 with breastfeeding first. If baby is too sleepy to breastfeed (attempt for up to 10 min), give supplementation. Discussed milk storage.     Patient Name: [name] [id number] Date: 04/10/2016 Reason for consult: Follow-up assessment   Maternal Data    Feeding Feeding Type: Breast Fed Nipple Type: Slow - flow Length of feed: 30 min  LATCH Score/Interventions Latch: Grasps breast easily, tongue down, lips flanged, rhythmical sucking. (latched upon entering)  Audible Swallowing: A few with stimulation  Type of Nipple: Everted at rest and after stimulation  Comfort (Breast/Nipple): Soft / non-tender     Hold (Positioning): Assistance needed to correctly position infant at breast and maintain latch.  LATCH Score: 8  Lactation Tools Discussed/Used     Consult Status Consult Status: Follow-up Date: 04/11/16 Follow-up type: In-patient    [name] [facility] 04/10/2016, 2:20 PM

## 2016-04-10 NOTE — Progress Notes (Signed)
POSTOPERATIVE DAY # 1 S/P CS - previous myomectomy in labor  S:         Reports feeling well - no pain at all             Tolerating po intake / no nausea / no vomiting / no flatus / no BM             Bleeding is light             Up ad lib / ambulatory/ voiding QS  Newborn breast feeding   O:  VS: BP 106/49 mmHg  Pulse 74  Temp(Src) 98 F (36.7 C) (Oral)  Resp 18  Ht 5\' 5"  (1.651 m)  Wt 92.534 kg (204 lb)  BMI 33.95 kg/m2  SpO2 100%  LMP 08/05/2015  Breastfeeding? Unknown   LABS:               Recent Labs  04/09/16 2354 04/10/16 0604  WBC 11.8* 14.3*  HGB 11.9* 8.3*  PLT 345 257               Bloodtype: --/--/O POS (05/14 2354)  Rubella: Immune (11/16 0000)                                            I&O: Intake/Output      05/14 0701 - 05/15 0700 05/15 0701 - 05/16 0700   P.O. 720    I.V. (mL/kg)     Other     Total Intake(mL/kg) 720 (7.8)    Urine (mL/kg/hr) 1620 (0.7)    Blood     Total Output 1620     Net -900                     Physical Exam:             Alert and Oriented X3  Lungs: Clear and unlabored  Heart: regular rate and rhythm / no mumurs  Abdomen: soft, non-tender, non-distended, active BS             Fundus: firm, non-tender, U-1             Dressing intact honeycomb              Incision:  approximated with suture / no erythema / no ecchymosis / no drainage  Perineum: intact  Lochia: light  Extremities: edema, no calf pain or tenderness, negative Homans  A:        POD # 1 S/P CS- previous myomectomy in labor            ABL anemia  P:        Routine postoperative care             Continue PNV and iron            Candidate for early DC tomorrow               Artelia Laroche CNM, MSN, [place] 04/10/2016, 8:08 AM

## 2016-04-11 MED ORDER — OXYCODONE-ACETAMINOPHEN 5-325 MG PO TABS: 1.0000 | ORAL_TABLET | Freq: Four times a day (QID) | ORAL | Status: DC | PRN

## 2016-04-11 MED ORDER — IBUPROFEN 600 MG PO TABS: 600.0000 mg | ORAL_TABLET | Freq: Four times a day (QID) | ORAL | Status: DC

## 2016-04-11 NOTE — Progress Notes (Signed)
Patient ID: [name], female   DOB: 06/19/1981, 35 y.o.   MRN: [id number] Subjective: S/P Cesarean Delivery for Previous Myomectomy / Active Labor POD# 2 Information for the patient's newborn:  Carden, Krizman [id number]  female  / circ deferred per peditrician  Reports feeling well. Desires an early d/c home today. Feeding: breast and bottle Patient reports tolerating PO.  Breast symptoms: none Pain controlled with ibuprofen (OTC) and narcotic analgesics including Percocet Denies HA/SOB/C/P/N/V/dizziness. Flatus present. (+) BM. She reports vaginal bleeding as normal, without clots.  She is ambulating, urinating without difficult.     Objective:   VS:  Filed Vitals:   04/09/16 2248 04/10/16 0200 04/10/16 0557 04/11/16 0631  BP: 114/58 111/62 106/49 112/69  Pulse: 70 71 74 79  Temp: 98.4 F (36.9 C) 98.2 F (36.8 C) 98 F (36.7 C) 98.2 F (36.8 C)  TempSrc:   Oral Oral  Resp: 20 20 18 18   Height:      Weight:      SpO2: 97% 98% 100%     No intake or output data in the 24 hours ending 04/11/16 0827      Recent Labs  04/09/16 2354 04/10/16 0604  WBC 11.8* 14.3*  HGB 11.9* 8.3*  HCT 34.5* 24.4*  PLT 345 257     Blood type: --/--/O POS (05/14 2354)  Rubella: Immune (11/16 0000)     Physical Exam:   General: alert, cooperative and no distress  Abdomen: soft, nontender, normal bowel sounds  Incision: clean, dry, intact and skin well-approximated with sutures and Dermabond  Uterine Fundus: firm, 2 FB below umbilicus, nontender  Lochia: minimal  Ext: extremities normal, atraumatic, no cyanosis or edema, Homans sign is negative, no sign of DVT and no edema, redness or tenderness in the calves or thighs   Assessment/Plan: 35 y.o.   POD# 2.  S/P Cesarean Delivery.  Indications: previous myomectomy / active labor                Principal Problem:   Postpartum care following cesarean delivery (5/14) Active Problems:   Uterine scar from previous surgery affecting  pregnancy  Doing well, stable.               Regular diet as tolerated Ambulate Routine post-op care D/C home today  Graceann Congress, MSN, CNM 04/11/2016, 8:27 AM

## 2016-04-11 NOTE — Lactation Note (Signed)
This note was copied from a baby's chart. Lactation Consultation Note LPI BF well and supplemented w/moms colostrum. After BF used DEBP and pumped 25 ml colostrum. Encouraged mom to hand express after pumping. Stressed importance of not feeding baby over 30 min. W/combined feedings.reviewed LPI behavior and feeding patterns. Encouraged to burp after BF and between amount in bottle. LC feels good about mom following plan and care for baby.  Patient Name: [name] [id number] Date: 04/11/2016 Reason for consult: Follow-up assessment;Infant < 6lbs   Maternal Data    Feeding    LATCH Score/Interventions Intervention(s): Skin to skin;Waking techniques     Type of Nipple: Everted at rest and after stimulation              Lactation Tools Discussed/Used     Consult Status Consult Status: Follow-up Date: 04/11/16 (in pm) Follow-up type: In-patient    Theodoro Kalata 04/11/2016, 5:52 AM

## 2016-04-11 NOTE — Discharge Summary (Signed)
OB Discharge Summary     Patient Name: [name] DOB: [birth year] MRN: [id number]  Date of admission: 04/08/2016 Delivering MD: Nayib Remer   Date of discharge: 04/11/2016  Admitting diagnosis: 36 1/7 weeks, active labor, previous myomectomy Intrauterine pregnancy: [redacted]w[redacted]d     Secondary diagnosis:  Principal Problem:   Postpartum care following cesarean delivery (5/14) Active Problems:   Uterine scar from previous surgery affecting pregnancy  Additional problems: fibroid uterus affecting pregnancy     Discharge diagnosis: Preterm Pregnancy Delivered , fibroid uterus, previous myomectomy                                                                                               Post partum procedures:none  Augmentation: none  Complications: [id number]  Hospital course:  Onset of Labor With Unplanned C/S  35 y.o. yo [id number] at [redacted]w[redacted]d was admitted in [place] on 04/08/2016. Patient had a labor course significant for dilation to 3 cm. Membrane Rupture Time/Date: 1:08 AM ,04/09/2016   The patient went for cesarean section due to Prior Uterine Surgery and active labor, and delivered a Viable infant,04/09/2016  Details of operation can be found in separate operative note. Patient had an uncomplicated postpartum course.  She is ambulating,tolerating a regular diet, passing flatus, and urinating well.  Patient is discharged home in stable condition 04/11/2016.  Physical exam  Filed Vitals:   04/09/16 2248 04/10/16 0200 04/10/16 0557 04/11/16 0631  BP: 114/58 111/62 106/49 112/69  Pulse: 70 71 74 79  Temp: 98.4 F (36.9 C) 98.2 F (36.8 C) 98 F (36.7 C) 98.2 F (36.8 C)  TempSrc:   Oral Oral  Resp: 20 20 18 18   Height:      Weight:      SpO2: 97% 98% 100%    General: alert, cooperative and no distress Lochia: appropriate Uterine Fundus: firm, midline, U-2 Incision: Healing well with no significant drainage, No significant erythema, Dressing is clean, dry,  and intact DVT Evaluation: No evidence of DVT seen on physical exam. Negative Homan's sign. No cords or calf tenderness. No significant calf/ankle edema. Labs: Lab Results  Component Value Date   WBC 14.3* 04/10/2016   HGB 8.3* 04/10/2016   HCT 24.4* 04/10/2016   MCV 93.8 04/10/2016   PLT 257 04/10/2016   No flowsheet data found.  Discharge instruction: per After Visit Summary and "Baby and Me Booklet".  After visit meds:    Medication List    TAKE these medications        FERREX 150 PO  Take 1 capsule by mouth daily.     ibuprofen 600 MG tablet  Commonly known as:  ADVIL,MOTRIN  Take 1 tablet (600 mg total) by mouth every 6 (six) hours.     oxyCODONE-acetaminophen 5-325 MG tablet  Commonly known as:  ROXICET  Take 1-2 tablets by mouth every 6 (six) hours as needed for severe pain.     prenatal multivitamin Tabs tablet  Take 1 tablet by mouth daily at 12 noon.        Diet: routine diet  Activity: Advance as tolerated. Pelvic rest  for 6 weeks.   Outpatient follow up:6 weeks with Dr Garwin Brothers Follow up Appt:No future appointments. Follow up Visit:No Follow-up on file.  Postpartum contraception: Undecided  Newborn Data: Live born female on 04/09/2016 Birth Weight: 4 lb 10.6 oz (2115 g) APGAR: 8, 9  Baby Feeding: Bottle and Breast Disposition:home with mother   04/11/2016 Laury Deep, Jerilynn Mages, MSN, CNM

## 2016-04-11 NOTE — Discharge Instructions (Signed)
Breast Pumping Tips °If you are breastfeeding, there may be times when you cannot feed your baby directly. Returning to work or going on a trip are common examples. Pumping allows you to store breast milk and feed it to your baby later.  °You may not get much milk when you first start to pump. Your breasts should start to make more after a few days. If you pump at the times you usually feed your baby, you may be able to keep making enough milk to feed your baby without also using formula. The more often you pump, the more milk you will produce.  °WHEN SHOULD I PUMP?  °· You can begin to pump soon after delivery. However, some experts recommend waiting about 4 weeks before giving your infant a bottle to make sure breastfeeding is going well.  °· If you plan to return to work, begin pumping a few weeks before. This will help you develop techniques that work best for you. It also lets you build up a supply of breast milk.   °· When you are with your infant, feed on demand and pump after each feeding.   °· When you are away from your infant for several hours, pump for about 15 minutes every 2-3 hours. Pump both breasts at the same time if you can.   °· If your infant has a formula feeding, make sure to pump around the same time.     °· If you drink any alcohol, wait 2 hours before pumping.   °HOW DO I PREPARE TO PUMP? °Your let-down reflex is the natural reaction to stimulation that makes your breast milk flow. It is easier to stimulate this reflex when you are relaxed. Find relaxation techniques that work for you. If you have difficulty with your let-down reflex, try these methods:  °· Smell one of your infant's blankets or an item of clothing.   °· Look at a picture or video of your infant.   °· Sit in a quiet, private space.   °· Massage the breast you plan to pump.   °· Place soothing warmth on the breast.   °· Play relaxing music.   °WHAT ARE SOME GENERAL BREAST PUMPING TIPS? °· Wash your hands before you pump. You  do not need to wash your nipples or breasts. °· There are three ways to pump. °· You can use your hand to massage and compress your breast. °· You can use a handheld manual pump. °· You can use an electric pump.   °· Make sure the suction cup (flange) on the breast pump is the right size. Place the flange directly over the nipple. If it is the wrong size or placed the wrong way, it may be painful and cause nipple damage.   °· If pumping is uncomfortable, apply a small amount of purified or modified lanolin to your nipple and areola. °· If you are using an electric pump, adjust the speed and suction power to be more comfortable. °· If pumping is painful or if you are not getting very much milk, you may need a different type of pump. A lactation consultant can help you determine what type of pump to use.   °· Keep a full water bottle near you at all times. Drinking lots of fluid helps you make more milk.  °· You can store your milk to use later. Pumped breast milk can be stored in a sealable, sterile container or plastic bag. Label all stored breast milk with the date you pumped it. °· Milk can stay out at room temperature for up to 8 hours. °·   You can store your milk in the refrigerator for up to 8 days. °· You can store your milk in the freezer for 3 months. Thaw frozen milk using warm water. Do not put it in the microwave. °· Do not smoke. Smoking can lower your milk supply and harm your infant. If you need help quitting, ask your health care provider to recommend a program.   °WHEN SHOULD I CALL MY HEALTH CARE PROVIDER OR A LACTATION CONSULTANT? °· You are having trouble pumping. °· You are concerned that you are not making enough milk. °· You have nipple pain, soreness, or redness. °· You want to use birth control. Birth control pills may lower your milk supply. Talk to your health care provider about your options. °  °This information is not intended to replace advice given to you by your health care provider.  Make sure you discuss any questions you have with your health care provider. °  °Document Released: 05/03/2010 Document Revised: 11/18/2013 Document Reviewed: 09/05/2013 °[employer]vier Interactive Patient Education ©2016 [employer] °Postpartum Depression and Baby Blues °The postpartum period begins right after the birth of a baby. During this time, there is often a great amount of joy and excitement. It is also a time of many changes in the life of the parents. Regardless of how many times a mother gives birth, each child brings new challenges and dynamics to the family. It is not unusual to have feelings of excitement along with confusing shifts in moods, emotions, and thoughts. All mothers are at risk of developing postpartum depression or the "baby blues." These mood changes can occur right after giving birth, or they may occur many months after giving birth. The baby blues or postpartum depression can be mild or severe. Additionally, postpartum depression can go away rather quickly, or it can be a long-term condition.  °CAUSES °Raised hormone levels and the rapid drop in those levels are thought to be a main cause of postpartum depression and the baby blues. A number of hormones change during and after pregnancy. Estrogen and progesterone usually decrease right after the delivery of your baby. The levels of thyroid hormone and various cortisol steroids also rapidly drop. Other factors that play a role in these mood changes include major life events and genetics.  °RISK FACTORS °If you have any of the following risks for the baby blues or postpartum depression, know what symptoms to watch out for during the postpartum period. Risk factors that may increase the likelihood of getting the baby blues or postpartum depression include: °· Having a personal or family history of depression.   °· Having depression while being pregnant.   °· Having premenstrual mood issues or mood issues related to oral  contraceptives. °· Having a lot of life stress.   °· Having marital conflict.   °· Lacking a social support network.   °· Having a baby with special needs.   °· Having health problems, such as diabetes.   °SIGNS AND SYMPTOMS °Symptoms of baby blues include: °· Brief changes in mood, such as going from extreme happiness to sadness. °· Decreased concentration.   °· Difficulty sleeping.   °· Crying spells, tearfulness.   °· Irritability.   °· Anxiety.   °Symptoms of postpartum depression typically begin within the first month after giving birth. These symptoms include: °· Difficulty sleeping or excessive sleepiness.   °· Marked weight loss.   °· Agitation.   °· Feelings of worthlessness.   °· Lack of interest in activity or food.   °Postpartum psychosis is a very serious condition and can be dangerous. Fortunately, it is   rare. Displaying any of the following symptoms is cause for immediate medical attention. Symptoms of postpartum psychosis include:  °· Hallucinations and delusions.   °· Bizarre or disorganized behavior.   °· Confusion or disorientation.   °DIAGNOSIS  °A diagnosis is made by an evaluation of your symptoms. There are no medical or lab tests that lead to a diagnosis, but there are various questionnaires that a health care provider may use to identify those with the baby blues, postpartum depression, or psychosis. Often, a screening tool called the Edinburgh Postnatal Depression Scale is used to diagnose depression in the postpartum period.  °TREATMENT °The baby blues usually goes away on its own in 1-2 weeks. Social support is often all that is needed. You will be encouraged to get adequate sleep and rest. Occasionally, you may be given medicines to help you sleep.  °Postpartum depression requires treatment because it can last several months or longer if it is not treated. Treatment may include individual or group therapy, medicine, or both to address any social, physiological, and psychological factors  that may play a role in the depression. Regular exercise, a healthy diet, rest, and social support may also be strongly recommended.  °Postpartum psychosis is more serious and needs treatment right away. Hospitalization is often needed. °HOME CARE INSTRUCTIONS °· Get as much rest as you can. Nap when the baby sleeps.   °· Exercise regularly. Some women find yoga and walking to be beneficial.   °· Eat a balanced and nourishing diet.   °· Do little things that you enjoy. Have a cup of tea, take a bubble bath, read your favorite magazine, or listen to your favorite music. °· Avoid alcohol.   °· Ask for help with household chores, cooking, grocery shopping, or running errands as needed. Do not try to do everything.   °· Talk to people close to you about how you are feeling. Get support from your partner, family members, friends, or other new moms. °· Try to stay positive in how you think. Think about the things you are grateful for.   °· Do not spend a lot of time alone.   °· Only take over-the-counter or prescription medicine as directed by your health care provider. °· Keep all your postpartum appointments.   °· Let your health care provider know if you have any concerns.   °SEEK MEDICAL CARE IF: °You are having a reaction to or problems with your medicine. °SEEK IMMEDIATE MEDICAL CARE IF: °· You have suicidal feelings.   °· You think you may harm the baby or someone else. °MAKE SURE YOU: °· Understand these instructions. °· Will watch your condition. °· Will get help right away if you are not doing well or get worse. °  °This information is not intended to replace advice given to you by your health care provider. Make sure you discuss any questions you have with your health care provider. °  °Document Released: 08/17/2004 Document Revised: 11/18/2013 Document Reviewed: 08/25/2013 °Elsevier Interactive Patient Education ©2016 [employer] °Postpartum Care After Cesarean Delivery °After you deliver your newborn  (postpartum period), the usual stay in the hospital is 24-72 hours. If there were problems with your labor or delivery, or if you have other medical problems, you might be in the hospital longer.  °While you are in the hospital, you will receive help and instructions on how to care for yourself and your newborn during the postpartum period.  °While you are in the hospital: °· It is normal for you to have pain or discomfort from the incision in your   abdomen. Be sure to tell your nurses when you are having pain, where the pain is located, and what makes the pain worse. °· If you are breastfeeding, you may feel uncomfortable contractions of your uterus for a couple of weeks. This is normal. The contractions help your uterus get back to normal size. °· It is normal to have some bleeding after delivery. °· For the first 1-3 days after delivery, the flow is red and the amount may be similar to a period. °· It is common for the flow to start and stop. °· In the first few days, you may pass some small clots. Let your nurses know if you begin to pass large clots or your flow increases. °· Do not  flush blood clots down the toilet before having the nurse look at them. °· During the next 3-10 days after delivery, your flow should become more watery and pink or brown-tinged in color. °· Ten to fourteen days after delivery, your flow should be a small amount of yellowish-white discharge. °· The amount of your flow will decrease over the first few weeks after delivery. Your flow may stop in 6-8 weeks. Most women have had their flow stop by 12 weeks after delivery. °· You should change your sanitary pads frequently. °· Wash your hands thoroughly with soap and water for at least 20 seconds after changing pads, using the toilet, or before holding or feeding your newborn. °· Your intravenous (IV) tubing will be removed when you are drinking enough fluids. °· The urine drainage tube (urinary catheter) that was inserted before delivery  may be removed within 6-8 hours after delivery or when feeling returns to your legs. You should feel like you need to empty your bladder within the first 6-8 hours after the catheter has been removed. °· In case you become weak, lightheaded, or faint, call your nurse before you get out of bed for the first time and before you take a shower for the first time. °· Within the first few days after delivery, your breasts may begin to feel tender and full. This is called engorgement. Breast tenderness usually goes away within 48-72 hours after engorgement occurs. You may also notice milk leaking from your breasts. If you are not breastfeeding, do not stimulate your breasts. Breast stimulation can make your breasts produce more milk. °· Spending as much time as possible with your newborn is very important. During this time, you and your newborn can feel close and get to know each other. Having your newborn stay in your room (rooming in) will help to strengthen the bond with your newborn. It will give you time to get to know your newborn and become comfortable caring for your newborn. °· Your hormones change after delivery. Sometimes the hormone changes can temporarily cause you to feel sad or tearful. These feelings should not last more than a few days. If these feelings last longer than that, you should talk to your caregiver. °· If desired, talk to your caregiver about methods of family planning or contraception. °· Talk to your caregiver about immunizations. Your caregiver may want you to have the following immunizations before leaving the hospital: °· Tetanus, diphtheria, and pertussis (Tdap) or tetanus and diphtheria (Td) immunization. It is very important that you and your family (including grandparents) or others caring for your newborn are up-to-date with the Tdap or Td immunizations. The Tdap or Td immunization can help protect your newborn from getting ill. °· Rubella immunization. °· Varicella (chickenpox)    immunization. °· Influenza immunization. You should receive this annual immunization if you did not receive the immunization during your pregnancy. °  °This information is not intended to replace advice given to you by your health care provider. Make sure you discuss any questions you have with your health care provider. °  °Document Released: 08/07/2012 Document Reviewed: 08/07/2012 °[employer]vier Interactive Patient Education ©2016 [employer] °Breastfeeding and Mastitis °Mastitis is inflammation of the breast tissue. It can occur in women who are breastfeeding. This can make breastfeeding painful. Mastitis will sometimes go away on its own. Your health care provider will help determine if treatment is needed. °CAUSES °Mastitis is often associated with a blocked milk (lactiferous) duct. This can happen when too much milk builds up in the breast. Causes of excess milk in the breast can include: °· Poor latch-on. If your baby is not latched onto the breast properly, she or he may not empty your breast completely while breastfeeding. °· Allowing too much time to pass between feedings. °· Wearing a bra or other clothing that is too tight. This puts extra pressure on the lactiferous ducts so milk does not flow through them as it should. °Mastitis can also be caused by a bacterial infection. Bacteria may enter the breast tissue through cuts or openings in the skin. In women who are breastfeeding, this may occur because of cracked or irritated skin. Cracks in the skin are often caused when your baby does not latch on properly to the breast. °SIGNS AND SYMPTOMS °· Swelling, redness, tenderness, and pain in an area of the breast. °· Swelling of the glands under the arm on the same side. °· Fever may or may not accompany mastitis. °If an infection is allowed to progress, a collection of pus (abscess) may develop. °DIAGNOSIS  °Your health care provider can usually diagnose mastitis based on your symptoms and a physical exam.  Tests may be done to help confirm the diagnosis. These may include: °· Removal of pus from the breast by applying pressure to the area. This pus can be examined in the lab to determine which bacteria are present. If an abscess has developed, the fluid in the abscess can be removed with a needle. This can also be used to confirm the diagnosis and determine the bacteria present. In most cases, pus will not be present. °· Blood tests to determine if your body is fighting a bacterial infection. °· Mammogram or ultrasound tests to rule out other problems or diseases. °TREATMENT  °Mastitis that occurs with breastfeeding will sometimes go away on its own. Your health care provider may choose to wait 24 hours after first seeing you to decide whether a prescription medicine is needed. If your symptoms are worse after 24 hours, your health care provider will likely prescribe an antibiotic medicine to treat the mastitis. He or she will determine which bacteria are most likely causing the infection and will then select an appropriate antibiotic medicine. This is sometimes changed based on the results of tests performed to identify the bacteria, or if there is no response to the antibiotic medicine selected. Antibiotic medicines are usually given by mouth. You may also be given medicine for pain. °HOME CARE INSTRUCTIONS °· Only take over-the-counter or prescription medicines for pain, fever, or discomfort as directed by your health care provider. °· If your health care provider prescribed an antibiotic medicine, take the medicine as directed. Make sure you finish it even if you start to feel better. °· Do not wear a   tight or underwire bra. Wear a soft, supportive bra. °· Increase your fluid intake, especially if you have a fever. °· Continue to empty the breast. Your health care provider can tell you whether this milk is safe for your infant or needs to be thrown out. You may be told to stop nursing until your health care  provider thinks it is safe for your baby. Use a breast pump if you are advised to stop nursing. °· Keep your nipples clean and dry. °· Empty the first breast completely before going to the other breast. If your baby is not emptying your breasts completely for some reason, use a breast pump to empty your breasts. °· If you go back to work, pump your breasts while at work to stay in time with your nursing schedule. °· Avoid allowing your breasts to become overly filled with milk (engorged). °SEEK MEDICAL CARE IF: °· You have pus-like discharge from the breast. °· Your symptoms do not improve with the treatment prescribed by your health care provider within 2 days. °SEEK IMMEDIATE MEDICAL CARE IF: °· Your pain and swelling are getting worse. °· You have pain that is not controlled with medicine. °· You have a red line extending from the breast toward your armpit. °· You have a fever or persistent symptoms for more than 2-3 days. °· You have a fever and your symptoms suddenly get worse. °MAKE SURE YOU:  °· Understand these instructions. °· Will watch your condition. °· Will get help right away if you are not doing well or get worse. °  °This information is not intended to replace advice given to you by your health care provider. Make sure you discuss any questions you have with your health care provider. °  °Document Released: 03/10/2005 Document Revised: 11/18/2013 Document Reviewed: 06/19/2013 °[employer]vier Interactive Patient Education ©2016 [employer] °Breastfeeding °Deciding to breastfeed is one of the best choices you can make for you and your baby. A change in hormones during pregnancy causes your breast tissue to grow and increases the number and size of your milk ducts. These hormones also allow proteins, sugars, and fats from your blood supply to make breast milk in your milk-producing glands. Hormones prevent breast milk from being released before your baby is born as well as prompt milk flow after birth. Once  breastfeeding has begun, thoughts of your baby, as well as his or her sucking or crying, can stimulate the release of milk from your milk-producing glands.  °BENEFITS OF BREASTFEEDING °For Your Baby °· Your first milk (colostrum) helps your baby's digestive system function better. °· There are antibodies in your milk that help your baby fight off infections. °· Your baby has a lower incidence of asthma, allergies, and sudden infant death syndrome. °· The nutrients in breast milk are better for your baby than infant formulas and are designed uniquely for your baby's needs. °· Breast milk improves your baby's brain development. °· Your baby is less likely to develop other conditions, such as childhood obesity, asthma, or type 2 diabetes mellitus. °For You °· Breastfeeding helps to create a very special bond between you and your baby. °· Breastfeeding is convenient. Breast milk is always available at the correct temperature and costs nothing. °· Breastfeeding helps to burn calories and helps you lose the weight gained during pregnancy. °· Breastfeeding makes your uterus contract to its prepregnancy size faster and slows bleeding (lochia) after you give birth.   °· Breastfeeding helps to lower your risk of developing type   2 diabetes mellitus, osteoporosis, and breast or ovarian cancer later in life. °SIGNS THAT YOUR BABY IS HUNGRY °Early Signs of Hunger °· Increased alertness or activity. °· Stretching. °· Movement of the head from side to side. °· Movement of the head and opening of the mouth when the corner of the mouth or cheek is stroked (rooting). °· Increased sucking sounds, smacking lips, cooing, sighing, or squeaking. °· Hand-to-mouth movements. °· Increased sucking of fingers or hands. °Late Signs of Hunger °· Fussing. °· Intermittent crying. °Extreme Signs of Hunger °Signs of extreme hunger will require calming and consoling before your baby will be able to breastfeed successfully. Do not wait for the  following signs of extreme hunger to occur before you initiate breastfeeding: °· Restlessness. °· A loud, strong cry. °· Screaming. °BREASTFEEDING BASICS °Breastfeeding Initiation °· Find a comfortable place to sit or lie down, with your neck and back well supported. °· Place a pillow or rolled up blanket under your baby to bring him or her to the level of your breast (if you are seated). Nursing pillows are specially designed to help support your arms and your baby while you breastfeed. °· Make sure that your baby's abdomen is facing your abdomen. °· Gently massage your breast. With your fingertips, massage from your chest wall toward your nipple in a circular motion. This encourages milk flow. You may need to continue this action during the feeding if your milk flows slowly. °· Support your breast with 4 fingers underneath and your thumb above your nipple. Make sure your fingers are well away from your nipple and your baby's mouth. °· Stroke your baby's lips gently with your finger or nipple. °· When your baby's mouth is open wide enough, quickly bring your baby to your breast, placing your entire nipple and as much of the colored area around your nipple (areola) as possible into your baby's mouth. °· More areola should be visible above your baby's upper lip than below the lower lip. °· Your baby's tongue should be between his or her lower gum and your breast. °· Ensure that your baby's mouth is correctly positioned around your nipple (latched). Your baby's lips should create a seal on your breast and be turned out (everted). °· It is common for your baby to suck about 2-3 minutes in order to start the flow of breast milk. °Latching °Teaching your baby how to latch on to your breast properly is very important. An improper latch can cause nipple pain and decreased milk supply for you and poor weight gain in your baby. Also, if your baby is not latched onto your nipple properly, he or she may swallow some air during  feeding. This can make your baby fussy. Burping your baby when you switch breasts during the feeding can help to get rid of the air. However, teaching your baby to latch on properly is still the best way to prevent fussiness from swallowing air while breastfeeding. °Signs that your baby has successfully latched on to your nipple: °· Silent tugging or silent sucking, without causing you pain. °· Swallowing heard between every 3-4 sucks. °· Muscle movement above and in front of his or her ears while sucking. °Signs that your baby has not successfully latched on to nipple: °· Sucking sounds or smacking sounds from your baby while breastfeeding. °· Nipple pain. °If you think your baby has not latched on correctly, slip your finger into the corner of your baby's mouth to break the suction and place it   between your baby's gums. Attempt breastfeeding initiation again. °Signs of Successful Breastfeeding °Signs from your baby: °· A gradual decrease in the number of sucks or complete cessation of sucking. °· Falling asleep. °· Relaxation of his or her body. °· Retention of a small amount of milk in his or her mouth. °· Letting go of your breast by himself or herself. °Signs from you: °· Breasts that have increased in firmness, weight, and size 1-3 hours after feeding. °· Breasts that are softer immediately after breastfeeding. °· Increased milk volume, as well as a change in milk consistency and color by the fifth day of breastfeeding. °· Nipples that are not sore, cracked, or bleeding. °Signs That Your Baby is Getting Enough Milk °· Wetting at least 3 diapers in a 24-hour period. The urine should be clear and pale yellow by age 5 days. °· At least 3 stools in a 24-hour period by age 5 days. The stool should be soft and yellow. °· At least 3 stools in a 24-hour period by age 7 days. The stool should be seedy and yellow. °· No loss of weight greater than 10% of birth weight during the first 3 days of age. °· Average weight  gain of 4-7 ounces (113-198 g) per week after age 4 days. °· Consistent daily weight gain by age 5 days, without weight loss after the age of 2 weeks. °After a feeding, your baby may spit up a small amount. This is common. °BREASTFEEDING FREQUENCY AND DURATION °Frequent feeding will help you make more milk and can prevent sore nipples and breast engorgement. Breastfeed when you feel the need to reduce the fullness of your breasts or when your baby shows signs of hunger. This is called "breastfeeding on demand." Avoid introducing a pacifier to your baby while you are working to establish breastfeeding (the first 4-6 weeks after your baby is born). After this time you may choose to use a pacifier. Research has shown that pacifier use during the first year of a baby's life decreases the risk of sudden infant death syndrome (SIDS). °Allow your baby to feed on each breast as long as he or she wants. Breastfeed until your baby is finished feeding. When your baby unlatches or falls asleep while feeding from the first breast, offer the second breast. Because newborns are often sleepy in the first few weeks of life, you may need to awaken your baby to get him or her to feed. °Breastfeeding times will vary from baby to baby. However, the following rules can serve as a guide to help you ensure that your baby is properly fed: °· Newborns (babies 4 weeks of age or younger) may breastfeed every 1-3 hours. °· Newborns should not go longer than 3 hours during the day or 5 hours during the night without breastfeeding. °· You should breastfeed your baby a minimum of 8 times in a 24-hour period until you begin to introduce solid foods to your baby at around 6 months of age. °BREAST MILK PUMPING °Pumping and storing breast milk allows you to ensure that your baby is exclusively fed your breast milk, even at times when you are unable to breastfeed. This is especially important if you are going back to work while you are still  breastfeeding or when you are not able to be present during feedings. Your lactation consultant can give you guidelines on how long it is safe to store breast milk. °A breast pump is a machine that allows you to pump milk   from your breast into a sterile bottle. The pumped breast milk can then be stored in a refrigerator or freezer. Some breast pumps are operated by hand, while others use electricity. Ask your lactation consultant which type will work best for you. Breast pumps can be purchased, but some hospitals and breastfeeding support groups lease breast pumps on a monthly basis. A lactation consultant can teach you how to hand express breast milk, if you prefer not to use a pump. °CARING FOR YOUR BREASTS WHILE YOU BREASTFEED °Nipples can become dry, cracked, and sore while breastfeeding. The following recommendations can help keep your breasts moisturized and healthy: °· Avoid using soap on your nipples. °· Wear a supportive bra. Although not required, special nursing bras and tank tops are designed to allow access to your breasts for breastfeeding without taking off your entire bra or top. Avoid wearing underwire-style bras or extremely tight bras. °· Air dry your nipples for 3-4 minutes after each feeding. °· Use only cotton bra pads to absorb leaked breast milk. Leaking of breast milk between feedings is normal. °· Use lanolin on your nipples after breastfeeding. Lanolin helps to maintain your skin's normal moisture barrier. If you use pure lanolin, you do not need to wash it off before feeding your baby again. Pure lanolin is not toxic to your baby. You may also hand express a few drops of breast milk and gently massage that milk into your nipples and allow the milk to air dry. °In the first few weeks after giving birth, some women experience extremely full breasts (engorgement). Engorgement can make your breasts feel heavy, warm, and tender to the touch. Engorgement peaks within 3-5 days after you give  birth. The following recommendations can help ease engorgement: °· Completely empty your breasts while breastfeeding or pumping. You may want to start by applying warm, moist heat (in the shower or with warm water-soaked hand towels) just before feeding or pumping. This increases circulation and helps the milk flow. If your baby does not completely empty your breasts while breastfeeding, pump any extra milk after he or she is finished. °· Wear a snug bra (nursing or regular) or tank top for 1-2 days to signal your body to slightly decrease milk production. °· Apply ice packs to your breasts, unless this is too uncomfortable for you. °· Make sure that your baby is latched on and positioned properly while breastfeeding. °If engorgement persists after 48 hours of following these recommendations, contact your health care provider or a lactation consultant. °OVERALL HEALTH CARE RECOMMENDATIONS WHILE BREASTFEEDING °· Eat healthy foods. Alternate between meals and snacks, eating 3 of each per day. Because what you eat affects your breast milk, some of the foods may make your baby more irritable than usual. Avoid eating these foods if you are sure that they are negatively affecting your baby. °· Drink milk, fruit juice, and water to satisfy your thirst (about 10 glasses a day). °· Rest often, relax, and continue to take your prenatal vitamins to prevent fatigue, stress, and anemia. °· Continue breast self-awareness checks. °· Avoid chewing and smoking tobacco. Chemicals from cigarettes that pass into breast milk and exposure to secondhand smoke may harm your baby. °· Avoid alcohol and drug use, including marijuana. °Some medicines that may be harmful to your baby can pass through breast milk. It is important to ask your health care provider before taking any medicine, including all over-the-counter and prescription medicine as well as vitamin and herbal supplements. °It is possible to become   pregnant while breastfeeding. If  birth control is desired, ask your health care provider about options that will be safe for your baby. °SEEK MEDICAL CARE IF: °· You feel like you want to stop breastfeeding or have become frustrated with breastfeeding. °· You have painful breasts or nipples. °· Your nipples are cracked or bleeding. °· Your breasts are red, tender, or warm. °· You have a swollen area on either breast. °· You have a fever or chills. °· You have nausea or vomiting. °· You have drainage other than breast milk from your nipples. °· Your breasts do not become full before feedings by the fifth day after you give birth. °· You feel sad and depressed. °· Your baby is too sleepy to eat well. °· Your baby is having trouble sleeping.   °· Your baby is wetting less than 3 diapers in a 24-hour period. °· Your baby has less than 3 stools in a 24-hour period. °· Your baby's skin or the white part of his or her eyes becomes yellow.   °· Your baby is not gaining weight by 5 days of age. °SEEK IMMEDIATE MEDICAL CARE IF: °· Your baby is overly tired (lethargic) and does not want to wake up and feed. °· Your baby develops an unexplained fever. °  °This information is not intended to replace advice given to you by your health care provider. Make sure you discuss any questions you have with your health care provider. °  °Document Released: 11/13/2005 Document Revised: 08/04/2015 Document Reviewed: 05/07/2013 °Elsevier Interactive Patient Education ©2016 [employer] ° °

## 2016-04-11 NOTE — Lactation Note (Signed)
This note was copied from a baby's chart. Lactation Consultation Note  Patient Name: [name] [id number] Date: 04/11/2016 Reason for consult: Follow-up assessment;Infant < 6lbs;Late preterm infant  64 is 87 hours old, baby has been consistent with breast feeding and supplementing with formula  And EBM ( if available ).  Per mom breast are fuller , warmer, and leaking when baby latched, baby sluggish to start and LC gave  Baby an formula appetizer , and  Baby opened wider to latch ,  fed 20 mins in laid back position with depth  And consistent pattern, multiply swallows, supplemented afterwards with 16 ml of formula.   Mom denies soreness, sore nipple and engorgement prevention and tx reviewed.  Mom post pumped after feeding for 20 mins and softened both breast well with over 2 oz EBM yield. Per mom the #27 Flanges felt alittle snug with pumping . LC provided @ #30 's for D/C.  LC plan for baby - Feed at least very 3 hours for 20 mins at the breast and supplement afterwards and work  Up to 30 ml every feeding. If baby is sluggish to start try and appetizer of EBM or formula , and then latch.  Post pumping is indicated due to the baby being LPT , < 5 pounds. ( protect establishing milk supply) mom aware.  Mom and dad receptive to returning for a LC F/U appt. Next  Wednesday 5/24 at 2:30 pm.  Appt. Reminder given to mom.  Mother informed of post-discharge support and given phone number to the lactation department, including services for phone call assistance; out-patient appointments; and breastfeeding support group. List of other breastfeeding resources in the community given in the handout. Encouraged mother to call for problems or concerns related to breastfeeding.   Maternal Data Has patient been taught Hand Expression?: Yes  Feeding Appetizer of 2 ml ,  Breast fed 20 mins  Supplemented afterwards 16 ml formula  Mom post pumped   LATCH Score/Interventions Latch: Grasps breast  easily, tongue down, lips flanged, rhythmical sucking. Intervention(s): Skin to skin;Teach feeding cues;Waking techniques Intervention(s): Adjust position;Assist with latch;Breast massage;Breast compression  Audible Swallowing: Spontaneous and intermittent  Type of Nipple: Everted at rest and after stimulation  Comfort (Breast/Nipple): Soft / non-tender     Hold (Positioning): Assistance needed to correctly position infant at breast and maintain latch. Intervention(s): Breastfeeding basics reviewed;Support Pillows;Position options;Skin to skin  LATCH Score: 9  Lactation Tools Discussed/Used WIC Program: No   Consult Status Consult Status: Follow-up Date: 04/11/16 Follow-up type: Out-patient    Myer Haff 04/11/2016, 11:23 AM

## 2016-04-14 ENCOUNTER — Encounter (HOSPITAL_COMMUNITY): Admission: RE | Admit: 2016-04-14 | Payer: Self-pay | Source: Ambulatory Visit

## 2016-04-14 HISTORY — DX: Obesity, unspecified: E66.9

## 2016-04-14 HISTORY — DX: Supervision of elderly primigravida, unspecified trimester: O09.519

## 2016-04-14 HISTORY — DX: Personal history of other infectious and parasitic diseases: Z86.19

## 2016-04-14 HISTORY — DX: Benign neoplasm of connective and other soft tissue, unspecified: D21.9

## 2016-04-17 ENCOUNTER — Inpatient Hospital Stay (HOSPITAL_COMMUNITY)
Admission: RE | Admit: 2016-04-17 | Payer: No Typology Code available for payment source | Source: Ambulatory Visit | Admitting: Obstetrics and Gynecology

## 2016-04-17 ENCOUNTER — Encounter (HOSPITAL_COMMUNITY): Admission: RE | Payer: Self-pay | Source: Ambulatory Visit

## 2016-04-17 SURGERY — Surgical Case
Anesthesia: Regional

## 2016-04-19 ENCOUNTER — Ambulatory Visit (HOSPITAL_COMMUNITY): Payer: No Typology Code available for payment source

## 2017-01-17 LAB — OB RESULTS CONSOLE ABO/RH: RH TYPE: POSITIVE

## 2017-01-17 LAB — OB RESULTS CONSOLE RUBELLA ANTIBODY, IGM: Rubella: IMMUNE

## 2017-01-17 LAB — OB RESULTS CONSOLE HEPATITIS B SURFACE ANTIGEN: Hepatitis B Surface Ag: NEGATIVE

## 2017-01-17 LAB — OB RESULTS CONSOLE GC/CHLAMYDIA
CHLAMYDIA, DNA PROBE: NEGATIVE
GC PROBE AMP, GENITAL: NEGATIVE

## 2017-01-17 LAB — OB RESULTS CONSOLE HIV ANTIBODY (ROUTINE TESTING): HIV: NONREACTIVE

## 2017-01-17 LAB — OB RESULTS CONSOLE RPR: RPR: NONREACTIVE

## 2017-01-17 LAB — OB RESULTS CONSOLE ANTIBODY SCREEN: Antibody Screen: NEGATIVE

## 2017-07-04 ENCOUNTER — Other Ambulatory Visit: Payer: Self-pay | Admitting: Obstetrics and Gynecology

## 2017-07-17 ENCOUNTER — Encounter (HOSPITAL_COMMUNITY): Payer: Self-pay | Admitting: *Deleted

## 2017-07-18 ENCOUNTER — Other Ambulatory Visit: Payer: Self-pay | Admitting: Obstetrics and Gynecology

## 2017-07-25 ENCOUNTER — Inpatient Hospital Stay (HOSPITAL_COMMUNITY)
Admission: AD | Admit: 2017-07-25 | Discharge: 2017-07-25 | Disposition: A | Discharge status: Home / Self Care | Payer: Commercial Managed Care - PPO | Source: Ambulatory Visit | Attending: Obstetrics & Gynecology | Admitting: Obstetrics & Gynecology

## 2017-07-25 ENCOUNTER — Encounter (HOSPITAL_COMMUNITY): Payer: Self-pay

## 2017-07-25 DIAGNOSIS — Z3A36 36 weeks gestation of pregnancy: Secondary | ICD-10-CM | POA: Diagnosis not present

## 2017-07-25 DIAGNOSIS — Z91040 Latex allergy status: Secondary | ICD-10-CM | POA: Insufficient documentation

## 2017-07-25 DIAGNOSIS — O4703 False labor before 37 completed weeks of gestation, third trimester: Secondary | ICD-10-CM | POA: Insufficient documentation

## 2017-07-25 DIAGNOSIS — O34219 Maternal care for unspecified type scar from previous cesarean delivery: Secondary | ICD-10-CM | POA: Insufficient documentation

## 2017-07-25 DIAGNOSIS — O09523 Supervision of elderly multigravida, third trimester: Secondary | ICD-10-CM | POA: Diagnosis not present

## 2017-07-25 DIAGNOSIS — Z8249 Family history of ischemic heart disease and other diseases of the circulatory system: Secondary | ICD-10-CM | POA: Insufficient documentation

## 2017-07-25 DIAGNOSIS — R109 Unspecified abdominal pain: Secondary | ICD-10-CM | POA: Diagnosis present

## 2017-07-25 DIAGNOSIS — Z8379 Family history of other diseases of the digestive system: Secondary | ICD-10-CM | POA: Diagnosis not present

## 2017-07-25 MED ORDER — CEFAZOLIN SODIUM-DEXTROSE 2-4 GM/100ML-% IV SOLN: 2.0000 g | INTRAVENOUS | Status: DC

## 2017-07-25 MED ORDER — LACTATED RINGERS IV SOLN: INTRAVENOUS | Status: DC

## 2017-07-25 NOTE — Progress Notes (Signed)
EFM disconnected for pt to ambulate.  Pt instructed to notify RN or come back to unit if lof or sudden sharp abdm pain.  Pt verb understanding.

## 2017-07-25 NOTE — Progress Notes (Signed)
After private discussion, pt & SO decide to ambulate & have cervix rechecked.  POC discussed & both verb understanding.

## 2017-07-25 NOTE — MAU Note (Signed)
Pt states intermittent UCs, but nothing reg.  Denies lof or vag bleeding. States + FM.  Was seen in office today for ROB & SVE there was 4cm.  Sent to MAU for eval & possible c/s.

## 2017-07-25 NOTE — Progress Notes (Signed)
EFM disconnected.  D/c instructions discussed & given. Pt & husband denies ques/concerns.

## 2017-07-25 NOTE — Progress Notes (Signed)
Dr Benjie Karvonen at bedside to assess pt & discuss POC.  Educated on c/ vs waiting for c/s due to no cervical change & preterm.  Options given to ambulate & reexam or d/c & return if UCs increase.  Questions answered; pt given time discuss with husband.

## 2017-07-25 NOTE — MAU Provider Note (Signed)
History     CSN: [id number]  Arrival date and time: 07/25/17 1321   None     Chief Complaint  Patient presents with  . Labor Eval   HPI felt a strong contraction in office and has been having "BH contractions" for few days. No bleeding/ fluid/ mucous. Good FMs.  Pt was noted to be 4 cm dilated in office per Dr Murrell Redden and was not dilated at all on 8/21. She presented to office for routine Ob visit and she felt one contraction and since she was offered vaginal exam she was checked. She has not been having significant contractions yet.   Prior term SVD, then SABs, then myomectomy, then 1'C/s for myomectomy scar -which was preterm 36 wks. Now on Makena inj, last dose last week.   Past Medical History:  Diagnosis Date  . AMA (advanced maternal age) primigravida 100+   . Fibroid   . Hx of varicella   . Obese   . Postpartum care following cesarean delivery (5/14) 04/09/2016  . Uterine scar from previous surgery affecting pregnancy 04/09/2016  . Vaginal Pap smear, abnormal     Past Surgical History:  Procedure Laterality Date  . CESAREAN SECTION N/A 04/09/2016   Procedure: CESAREAN SECTION;  Surgeon: Servando Salina, MD;  Location: [facility];  Service: Obstetrics;  Laterality: N/A;  . CRYOTHERAPY    . LAPAROSCOPY    . MYOMECTOMY    . POLYPECTOMY    . TONSILLECTOMY    . WISDOM TOOTH EXTRACTION      Family History  Problem Relation Age of Onset  . Hypertension Mother   . GER disease Mother   . Hypertension Father   . Crohn's disease Brother   . Hypertension Brother   . Diabetes Maternal Grandmother   . Cancer Maternal Grandfather   . Uterine cancer Paternal Grandmother   . Cancer Paternal Grandfather     Social History  Substance Use Topics  . Smoking status: Never Smoker  . Smokeless tobacco: Never Used  . Alcohol use No    Allergies:  Allergies  Allergen Reactions  . Latex     Irritation   . Soybean-Containing Drug Products Hives and Itching     Prescriptions Prior to Admission  Medication Sig Dispense Refill Last Dose  . Polysaccharide Iron Complex (FERREX 150 PO) Take 1 capsule by mouth at bedtime.    07/24/2017 at Unknown time  . Prenatal Vit-Fe Fumarate-FA (PRENATAL MULTIVITAMIN) TABS tablet Take 1 tablet by mouth at bedtime.    07/24/2017 at Unknown time    Review of Systems Physical Exam   Blood pressure 120/73, pulse 87, temperature 99 F (37.2 C), temperature source Oral, resp. rate 18, unknown if currently breastfeeding.  Physical Exam FHT 130s/ + accels/ no decels/ moderate variability Toco- one  In 30 minutes  SVD 3/50%/-2/ Vtx   MAU Course  Procedures  Reviewed threatened preterm labor, D/c home since 4 cm per Dr Murrell Redden in office and 3 cm for me here over 1 hr interval. She wants to wait and watch if UCs picked up.  Ambulated and RN check after 1 hr of my exam.  Pt denies any change in symptoms, denies painful regular contractions. SVE- per RN unchanged, 3 cm, high station.   Assessment and Plan  36.4 wks, threatened preterm labor with cervical dilation but no further dilation and no contractions. Considering she is still 36.4 wks, will not deliver for just dilatation since contractions have resolved.  PTL precautions. FAC reviewed.  Sweta Halseth R 07/25/2017, 2:29 PM

## 2017-07-26 NOTE — Patient Instructions (Signed)
[place]  07/26/2017   Your procedure is scheduled on:  08/01/2017  Enter through the Main Entrance of [facility] at [place] up the phone at the desk and dial [phone].   Call this number if you have problems the morning of surgery: [phone]   Remember:   Do not eat food:After Midnight.  Do not drink clear liquids: After Midnight.  Take these medicines the morning of surgery with A SIP OF WATER: none   Do not wear jewelry, make-up or nail polish.  Do not wear lotions, powders, or perfumes. Do not wear deodorant.  Do not shave 48 hours prior to surgery.  Do not bring valuables to the hospital.  [facility] is not   responsible for any belongings or valuables brought to the hospital.  Contacts, dentures or bridgework may not be worn into surgery.  Leave suitcase in the car. After surgery it may be brought to your room.  For patients admitted to the hospital, checkout time is 11:00 AM the day of              discharge.   Patients discharged the day of surgery will not be allowed to drive             home.  Name and phone number of your driver: na  Special Instructions:   N/A   Please read over the following fact sheets that you were given:   Surgical Site Infection Prevention

## 2017-07-30 MED ORDER — PROPRANOLOL HCL 1 MG/ML IV SOLN
INTRAVENOUS | Status: AC
  Filled 2017-07-30: qty 11

## 2017-07-30 MED ORDER — LIDOCAINE HCL 1 % IJ SOLN
INTRAMUSCULAR | Status: AC
  Filled 2017-07-30: qty 20

## 2017-07-30 MED ORDER — PHENYLEPHRINE HCL 10 MG/ML IJ SOLN
INTRAMUSCULAR | Status: AC
  Filled 2017-07-30: qty 1

## 2017-07-30 MED ORDER — SCOPOLAMINE 1 MG/3DAYS TD PT72
MEDICATED_PATCH | TRANSDERMAL | Status: AC
  Filled 2017-07-30: qty 1

## 2017-07-30 MED ORDER — ONDANSETRON HCL 4 MG/2ML IJ SOLN
INTRAMUSCULAR | Status: AC
  Filled 2017-07-30: qty 6

## 2017-07-31 ENCOUNTER — Encounter (HOSPITAL_COMMUNITY)
Admission: RE | Admit: 2017-07-31 | Discharge: 2017-07-31 | Disposition: A | Discharge status: Home / Self Care | Payer: Commercial Managed Care - PPO | Source: Ambulatory Visit | Attending: Obstetrics and Gynecology | Admitting: Obstetrics and Gynecology

## 2017-07-31 ENCOUNTER — Inpatient Hospital Stay (HOSPITAL_COMMUNITY): Payer: Commercial Managed Care - PPO | Admitting: Certified Registered Nurse Anesthetist

## 2017-07-31 ENCOUNTER — Inpatient Hospital Stay (HOSPITAL_COMMUNITY)
Admission: AD | Admit: 2017-07-31 | Discharge: 2017-08-03 | DRG: 766 | Disposition: A | Discharge status: Home / Self Care | Payer: Commercial Managed Care - PPO | Source: Ambulatory Visit | Attending: Obstetrics and Gynecology | Admitting: Obstetrics and Gynecology

## 2017-07-31 ENCOUNTER — Encounter (HOSPITAL_COMMUNITY)
Admission: AD | Disposition: A | Discharge status: Home / Self Care | Payer: Self-pay | Source: Ambulatory Visit | Attending: Obstetrics and Gynecology

## 2017-07-31 ENCOUNTER — Encounter (HOSPITAL_COMMUNITY): Payer: Self-pay

## 2017-07-31 DIAGNOSIS — O34211 Maternal care for low transverse scar from previous cesarean delivery: Secondary | ICD-10-CM | POA: Diagnosis present

## 2017-07-31 DIAGNOSIS — N838 Other noninflammatory disorders of ovary, fallopian tube and broad ligament: Secondary | ICD-10-CM | POA: Diagnosis present

## 2017-07-31 DIAGNOSIS — O99824 Streptococcus B carrier state complicating childbirth: Secondary | ICD-10-CM | POA: Diagnosis present

## 2017-07-31 DIAGNOSIS — D259 Leiomyoma of uterus, unspecified: Secondary | ICD-10-CM | POA: Diagnosis present

## 2017-07-31 DIAGNOSIS — Z302 Encounter for sterilization: Secondary | ICD-10-CM

## 2017-07-31 DIAGNOSIS — O34219 Maternal care for unspecified type scar from previous cesarean delivery: Secondary | ICD-10-CM | POA: Diagnosis present

## 2017-07-31 DIAGNOSIS — O3483 Maternal care for other abnormalities of pelvic organs, third trimester: Secondary | ICD-10-CM | POA: Diagnosis present

## 2017-07-31 DIAGNOSIS — Z98891 History of uterine scar from previous surgery: Secondary | ICD-10-CM

## 2017-07-31 DIAGNOSIS — O3413 Maternal care for benign tumor of corpus uteri, third trimester: Secondary | ICD-10-CM | POA: Diagnosis present

## 2017-07-31 DIAGNOSIS — Z3A37 37 weeks gestation of pregnancy: Secondary | ICD-10-CM | POA: Diagnosis not present

## 2017-07-31 DIAGNOSIS — Z3493 Encounter for supervision of normal pregnancy, unspecified, third trimester: Secondary | ICD-10-CM | POA: Diagnosis present

## 2017-07-31 HISTORY — DX: Unspecified abnormal cytological findings in specimens from vagina: R87.629

## 2017-07-31 LAB — TYPE AND SCREEN
ABO/RH(D): O POS
ABO/RH(D): O POS
Antibody Screen: NEGATIVE
Antibody Screen: NEGATIVE

## 2017-07-31 LAB — CBC
HEMATOCRIT: 32.9 % — AB (ref 36.0–46.0)
HEMATOCRIT: 33.1 % — AB (ref 36.0–46.0)
HEMOGLOBIN: 11.2 g/dL — AB (ref 12.0–15.0)
HEMOGLOBIN: 11.2 g/dL — AB (ref 12.0–15.0)
MCH: 30.8 pg (ref 26.0–34.0)
MCH: 30.5 pg (ref 26.0–34.0)
MCHC: 34 g/dL (ref 30.0–36.0)
MCHC: 33.8 g/dL (ref 30.0–36.0)
MCV: 90.4 fL (ref 78.0–100.0)
MCV: 90.2 fL (ref 78.0–100.0)
Platelets: 326 10*3/uL (ref 150–400)
Platelets: 319 10*3/uL (ref 150–400)
RBC: 3.64 MIL/uL — ABNORMAL LOW (ref 3.87–5.11)
RBC: 3.67 MIL/uL — AB (ref 3.87–5.11)
RDW: 18.2 % — ABNORMAL HIGH (ref 11.5–15.5)
RDW: 18.2 % — ABNORMAL HIGH (ref 11.5–15.5)
WBC: 10.2 10*3/uL (ref 4.0–10.5)
WBC: 11.5 10*3/uL — ABNORMAL HIGH (ref 4.0–10.5)

## 2017-07-31 SURGERY — Surgical Case
Anesthesia: Spinal

## 2017-07-31 MED ORDER — CEFAZOLIN SODIUM-DEXTROSE 2-4 GM/100ML-% IV SOLN
INTRAVENOUS | Status: AC
  Filled 2017-07-31: qty 100

## 2017-07-31 MED ORDER — TRIAMCINOLONE ACETONIDE 40 MG/ML IJ SUSP
Freq: Once | INTRAMUSCULAR | Status: DC
  Filled 2017-07-31: qty 10

## 2017-07-31 MED ORDER — SCOPOLAMINE 1 MG/3DAYS TD PT72
MEDICATED_PATCH | TRANSDERMAL | Status: DC | PRN
  Administered 2017-07-31: 1 via TRANSDERMAL

## 2017-07-31 MED ORDER — SODIUM CHLORIDE 0.9 % IR SOLN
Status: DC | PRN
  Administered 2017-07-31: 1

## 2017-07-31 MED ORDER — LACTATED RINGERS IV SOLN
INTRAVENOUS | Status: DC
  Administered 2017-07-31: 22:00:00 via INTRAVENOUS

## 2017-07-31 MED ORDER — FAMOTIDINE IN NACL 20-0.9 MG/50ML-% IV SOLN
20.0000 mg | Freq: Once | INTRAVENOUS | Status: AC
Start: 2017-07-31 — End: 2017-07-31
  Administered 2017-07-31: 20 mg via INTRAVENOUS

## 2017-07-31 MED ORDER — PHENYLEPHRINE 8 MG IN D5W 100 ML (0.08MG/ML) PREMIX OPTIME
INJECTION | INTRAVENOUS | Status: AC
  Filled 2017-07-31: qty 100

## 2017-07-31 MED ORDER — SCOPOLAMINE 1 MG/3DAYS TD PT72
MEDICATED_PATCH | TRANSDERMAL | Status: AC
  Filled 2017-07-31: qty 1

## 2017-07-31 MED ORDER — OXYTOCIN 10 UNIT/ML IJ SOLN
INTRAMUSCULAR | Status: AC
  Filled 2017-07-31: qty 4

## 2017-07-31 MED ORDER — FENTANYL CITRATE (PF) 100 MCG/2ML IJ SOLN
INTRAMUSCULAR | Status: DC | PRN
  Administered 2017-07-31: 20 ug via INTRATHECAL

## 2017-07-31 MED ORDER — MORPHINE SULFATE (PF) 0.5 MG/ML IJ SOLN
INTRAMUSCULAR | Status: DC | PRN
  Administered 2017-07-31: .2 mg via INTRATHECAL

## 2017-07-31 MED ORDER — ONDANSETRON HCL 4 MG/2ML IJ SOLN
INTRAMUSCULAR | Status: DC | PRN
  Administered 2017-07-31: 4 mg via INTRAVENOUS

## 2017-07-31 MED ORDER — CEFAZOLIN SODIUM-DEXTROSE 2-4 GM/100ML-% IV SOLN
2.0000 g | INTRAVENOUS | Status: DC
  Filled 2017-07-31: qty 100

## 2017-07-31 MED ORDER — CEFAZOLIN SODIUM-DEXTROSE 2-3 GM-% IV SOLR
INTRAVENOUS | Status: DC | PRN
  Administered 2017-07-31: 2 g via INTRAVENOUS

## 2017-07-31 MED ORDER — OXYTOCIN 10 UNIT/ML IJ SOLN
INTRAVENOUS | Status: DC | PRN
  Administered 2017-07-31: 40 [IU] via INTRAVENOUS

## 2017-07-31 MED ORDER — SOD CITRATE-CITRIC ACID 500-334 MG/5ML PO SOLN
ORAL | Status: AC
  Administered 2017-07-31: 30 mL via ORAL
  Filled 2017-07-31: qty 15

## 2017-07-31 MED ORDER — FENTANYL CITRATE (PF) 100 MCG/2ML IJ SOLN
INTRAMUSCULAR | Status: AC
  Filled 2017-07-31: qty 2

## 2017-07-31 MED ORDER — BUPIVACAINE HCL (PF) 0.25 % IJ SOLN
INTRAMUSCULAR | Status: AC
  Filled 2017-07-31: qty 10

## 2017-07-31 MED ORDER — MEPERIDINE HCL 25 MG/ML IJ SOLN
INTRAMUSCULAR | Status: AC
  Filled 2017-07-31: qty 1

## 2017-07-31 MED ORDER — LACTATED RINGERS IV SOLN
INTRAVENOUS | Status: DC | PRN
  Administered 2017-07-31: 23:00:00 via INTRAVENOUS

## 2017-07-31 MED ORDER — SOD CITRATE-CITRIC ACID 500-334 MG/5ML PO SOLN
30.0000 mL | Freq: Once | ORAL | Status: AC
  Administered 2017-07-31: 30 mL via ORAL

## 2017-07-31 MED ORDER — ONDANSETRON HCL 4 MG/2ML IJ SOLN
INTRAMUSCULAR | Status: AC
  Filled 2017-07-31: qty 2

## 2017-07-31 MED ORDER — DEXAMETHASONE SODIUM PHOSPHATE 10 MG/ML IJ SOLN
INTRAMUSCULAR | Status: AC
  Filled 2017-07-31: qty 1

## 2017-07-31 MED ORDER — FAMOTIDINE IN NACL 20-0.9 MG/50ML-% IV SOLN
INTRAVENOUS | Status: AC
  Administered 2017-07-31: 20 mg via INTRAVENOUS
  Filled 2017-07-31: qty 50

## 2017-07-31 MED ORDER — PHENYLEPHRINE 8 MG IN D5W 100 ML (0.08MG/ML) PREMIX OPTIME
INJECTION | INTRAVENOUS | Status: DC | PRN
  Administered 2017-07-31: 60 ug/min via INTRAVENOUS

## 2017-07-31 MED ORDER — MEPERIDINE HCL 25 MG/ML IJ SOLN
INTRAMUSCULAR | Status: DC | PRN
  Administered 2017-07-31 (×2): 12.5 mg via INTRAVENOUS

## 2017-07-31 MED ORDER — DEXAMETHASONE SODIUM PHOSPHATE 10 MG/ML IJ SOLN
INTRAMUSCULAR | Status: DC | PRN
  Administered 2017-07-31: 10 mg via INTRAVENOUS

## 2017-07-31 MED ORDER — BUPIVACAINE IN DEXTROSE 0.75-8.25 % IT SOLN
INTRATHECAL | Status: AC
  Filled 2017-07-31: qty 2

## 2017-07-31 MED ORDER — BUPIVACAINE HCL (PF) 0.25 % IJ SOLN
INTRAMUSCULAR | Status: DC | PRN
  Administered 2017-07-31: 30 mL

## 2017-07-31 MED ORDER — LACTATED RINGERS IV SOLN
INTRAVENOUS | Status: DC | PRN
  Administered 2017-07-31 (×3): via INTRAVENOUS

## 2017-07-31 MED ORDER — BUPIVACAINE IN DEXTROSE 0.75-8.25 % IT SOLN
INTRATHECAL | Status: DC | PRN
  Administered 2017-07-31: 1.4 mL via INTRATHECAL

## 2017-07-31 MED ORDER — MORPHINE SULFATE (PF) 0.5 MG/ML IJ SOLN
INTRAMUSCULAR | Status: AC
  Filled 2017-07-31: qty 10

## 2017-07-31 MED ORDER — BUPIVACAINE HCL (PF) 0.25 % IJ SOLN
INTRAMUSCULAR | Status: AC
Start: 2017-07-31 — End: ?
  Filled 2017-07-31: qty 10

## 2017-07-31 SURGICAL SUPPLY — 47 items
BARRIER ADHS 3X4 INTERCEED (GAUZE/BANDAGES/DRESSINGS) ×3 IMPLANT
BENZOIN TINCTURE PRP APPL 2/3 (GAUZE/BANDAGES/DRESSINGS) IMPLANT
CLAMP CORD UMBIL (MISCELLANEOUS) IMPLANT
CLOSURE WOUND 1/2 X4 (GAUZE/BANDAGES/DRESSINGS) ×1
CLOTH BEACON ORANGE TIMEOUT ST (SAFETY) ×3 IMPLANT
CONTAINER PREFILL 10% NBF 15ML (MISCELLANEOUS) IMPLANT
DERMABOND ADVANCED (GAUZE/BANDAGES/DRESSINGS) ×2
DERMABOND ADVANCED .7 DNX12 (GAUZE/BANDAGES/DRESSINGS) ×1 IMPLANT
DRAPE C SECTION CLR SCREEN (DRAPES) ×3 IMPLANT
DRSG OPSITE POSTOP 4X10 (GAUZE/BANDAGES/DRESSINGS) ×3 IMPLANT
DURAPREP 26ML APPLICATOR (WOUND CARE) ×3 IMPLANT
ELECT REM PT RETURN 9FT ADLT (ELECTROSURGICAL) ×3
ELECTRODE REM PT RTRN 9FT ADLT (ELECTROSURGICAL) ×1 IMPLANT
EXTRACTOR VACUUM M CUP 4 TUBE (SUCTIONS) IMPLANT
EXTRACTOR VACUUM M CUP 4' TUBE (SUCTIONS)
GAUZE SPONGE 4X4 12PLY STRL LF (GAUZE/BANDAGES/DRESSINGS) ×6 IMPLANT
GLOVE BIOGEL PI IND STRL 7.0 (GLOVE) ×2 IMPLANT
GLOVE BIOGEL PI INDICATOR 7.0 (GLOVE) ×4
GLOVE ECLIPSE 6.5 STRL STRAW (GLOVE) ×3 IMPLANT
GOWN STRL REUS W/TWL LRG LVL3 (GOWN DISPOSABLE) ×6 IMPLANT
KIT ABG SYR 3ML LUER SLIP (SYRINGE) IMPLANT
NEEDLE HYPO 22GX1.5 SAFETY (NEEDLE) ×3 IMPLANT
NEEDLE HYPO 25X5/8 SAFETYGLIDE (NEEDLE) ×3 IMPLANT
NS IRRIG 1000ML POUR BTL (IV SOLUTION) ×3 IMPLANT
PACK C SECTION WH (CUSTOM PROCEDURE TRAY) ×3 IMPLANT
PAD ABD 8X7 1/2 STERILE (GAUZE/BANDAGES/DRESSINGS) ×3 IMPLANT
PAD OB MATERNITY 4.3X12.25 (PERSONAL CARE ITEMS) ×3 IMPLANT
RTRCTR C-SECT PINK 25CM LRG (MISCELLANEOUS) ×3 IMPLANT
STRIP CLOSURE SKIN 1/2X4 (GAUZE/BANDAGES/DRESSINGS) ×2 IMPLANT
SUT CHROMIC GUT AB #0 18 (SUTURE) ×3 IMPLANT
SUT MNCRL 0 VIOLET CTX 36 (SUTURE) ×3 IMPLANT
SUT MON AB 2-0 SH 27 (SUTURE) ×2
SUT MON AB 2-0 SH27 (SUTURE) ×1 IMPLANT
SUT MON AB 3-0 SH 27 (SUTURE) ×2
SUT MON AB 3-0 SH27 (SUTURE) ×1 IMPLANT
SUT MON AB 4-0 PS1 27 (SUTURE) IMPLANT
SUT MONOCRYL 0 CTX 36 (SUTURE) ×6
SUT PLAIN 2 0 (SUTURE) ×2
SUT PLAIN 2 0 XLH (SUTURE) IMPLANT
SUT PLAIN ABS 2-0 CT1 27XMFL (SUTURE) ×1 IMPLANT
SUT VIC AB 0 CT1 36 (SUTURE) ×6 IMPLANT
SUT VIC AB 2-0 CT1 27 (SUTURE) ×2
SUT VIC AB 2-0 CT1 TAPERPNT 27 (SUTURE) ×1 IMPLANT
SUT VIC AB 4-0 PS2 27 (SUTURE) IMPLANT
SYR CONTROL 10ML LL (SYRINGE) ×3 IMPLANT
TOWEL OR 17X24 6PK STRL BLUE (TOWEL DISPOSABLE) ×3 IMPLANT
TRAY FOLEY BAG SILVER LF 14FR (SET/KITS/TRAYS/PACK) ×3 IMPLANT

## 2017-07-31 NOTE — Consult Note (Signed)
Neonatology Note:   Attendance at C-section:    I was asked by Dr. Ronita Hipps to attend this repeat C/S at 37 3/7 weeks, presenting in labor. The mother is a G8P2A5 O pos, GBS positive with fibroids, otherwise an uncomplicated pregnancy. ROM at delivery, fluid clear. Infant vigorous with good spontaneous cry and tone. Needed no suctioning. Ap 9/10. Lungs clear to ausc in DR. To CN to care of Pediatrician.   Real Cons, MD

## 2017-07-31 NOTE — Anesthesia Preprocedure Evaluation (Addendum)
Anesthesia Evaluation  Patient identified by MRN, date of birth, ID band Patient awake    Reviewed: Allergy & Precautions, NPO status , Patient's Chart, lab work & pertinent test results  Airway Mallampati: II       Dental no notable dental hx. (+) Teeth Intact   Pulmonary neg pulmonary ROS,    Pulmonary exam normal breath sounds clear to auscultation       Cardiovascular negative cardio ROS Normal cardiovascular exam Rhythm:Regular Rate:Normal     Neuro/Psych negative neurological ROS  negative psych ROS   GI/Hepatic negative GI ROS, Neg liver ROS,   Endo/Other  negative endocrine ROS  Renal/GU negative Renal ROS     Musculoskeletal negative musculoskeletal ROS (+)   Abdominal (+) + obese,   Peds  Hematology negative hematology ROS (+)   Anesthesia Other Findings   Reproductive/Obstetrics (+) Pregnancy                            Anesthesia Physical Anesthesia Plan  ASA: II  Anesthesia Plan: Spinal   Post-op Pain Management:    Induction:   PONV Risk Score and Plan: 3 and Ondansetron, Dexamethasone and Scopolamine patch - Pre-op  Airway Management Planned:   Additional Equipment:   Intra-op Plan:   Post-operative Plan:   Informed Consent: I have reviewed the patients History and Physical, chart, labs and discussed the procedure including the risks, benefits and alternatives for the proposed anesthesia with the patient or authorized representative who has indicated his/her understanding and acceptance.     Plan Discussed with: CRNA and Surgeon  Anesthesia Plan Comments:        Anesthesia Quick Evaluation

## 2017-07-31 NOTE — H&P (Signed)
Akia Montalban is a 36 y.o. female presenting for contractions. OB History    Gravida Para Term Preterm AB Living   8 2   1 5 2    SAB TAB Ectopic Multiple Live Births   5     0 2     Past Medical History:  Diagnosis Date  . AMA (advanced maternal age) primigravida 25+   . Fibroid   . Hx of varicella   . Obese   . Postpartum care following cesarean delivery (5/14) 04/09/2016  . Uterine scar from previous surgery affecting pregnancy 04/09/2016  . Vaginal Pap smear, abnormal    Past Surgical History:  Procedure Laterality Date  . CESAREAN SECTION N/A 04/09/2016   Procedure: CESAREAN SECTION;  Surgeon: Servando Salina, MD;  Location: [facility];  Service: Obstetrics;  Laterality: N/A;  . CRYOTHERAPY    . LAPAROSCOPY    . MYOMECTOMY    . POLYPECTOMY    . TONSILLECTOMY    . WISDOM TOOTH EXTRACTION     Family History: family history includes Cancer in her maternal grandfather and paternal grandfather; Crohn's disease in her brother; Diabetes in her maternal grandmother; GER disease in her mother; Hypertension in her brother, father, and mother; Uterine cancer in her paternal grandmother. Social History:  reports that she has never smoked. She has never used smokeless tobacco. She reports that she does not drink alcohol or use drugs.     Maternal Diabetes: No Genetic Screening: Normal Maternal Ultrasounds/Referrals: Normal Fetal Ultrasounds or other Referrals:  None Maternal Substance Abuse:  No Significant Maternal Medications:  None Significant Maternal Lab Results:  None Other Comments:  None  Review of Systems  Constitutional: Negative.   All other systems reviewed and are negative.  Maternal Medical History:  Reason for admission: Contractions.   Contractions: Onset was 3-5 hours ago.   Frequency: irregular.   Perceived severity is strong.    Fetal activity: Perceived fetal activity is normal.   Last perceived fetal movement was within the past hour.     Prenatal complications: no prenatal complications Prenatal Complications - Diabetes: none.    Dilation: 5 Effacement (%): 80 Station: -1 Exam by:: Delena Serve, RNC Blood pressure 125/79, pulse 83, temperature 98.7 F (37.1 C), temperature source Oral, resp. rate 19, unknown if currently breastfeeding. Maternal Exam:  Uterine Assessment: Contraction strength is moderate.  Contraction frequency is irregular.   Abdomen: Patient reports no abdominal tenderness. Surgical scars: low transverse.   Fetal presentation: vertex  Introitus: Normal vulva. Normal vagina.  Ferning test: not done.  Nitrazine test: not done. Amniotic fluid character: not assessed.  Pelvis: questionable for delivery.   Cervix: Cervix evaluated by digital exam.     Physical Exam  Nursing note and vitals reviewed. Constitutional: She is oriented to person, place, and time. She appears well-developed and well-nourished.  HENT:  Head: Normocephalic and atraumatic.  Neck: Normal range of motion. Neck supple.  Cardiovascular: Normal rate and regular rhythm.   Respiratory: Effort normal and breath sounds normal.  GI: Soft. Bowel sounds are normal.  Genitourinary: Vagina normal and uterus normal.  Musculoskeletal: Normal range of motion.  Neurological: She is alert and oriented to person, place, and time. She has normal reflexes.  Skin: Skin is warm and dry.  Psychiatric: She has a normal mood and affect.    Prenatal labs: ABO, Rh: --/--/O POS (09/04 1200) Antibody: NEG (09/04 1200) Rubella: Immune (02/21 0000) RPR: Nonreactive (02/21 0000)  HBsAg: Negative (02/21 0000)  HIV:  Non-reactive (02/21 0000)  GBS:     Assessment/Plan: 37 wk IUP  Early labor History of myomectomy Repeat csection and TL desired Consent done.  Everson Mott J 07/31/2017, 9:32 PM

## 2017-07-31 NOTE — MAU Note (Signed)
C section scheduled for tomorrow afternoon.  Started having ctx around 0900, now every 5 mins.  No LOF/VB.  GBS +

## 2017-07-31 NOTE — Transfer of Care (Signed)
Immediate Anesthesia Transfer of Care Note  Patient: [name]  Procedure(s) Performed: Procedure(s) with comments: CESAREAN SECTION WITH BILATERAL TUBAL LIGATION (N/A) - EDD: 08/18/17 Allergy: Latex, Soybean Oil  Patient Location: PACU  Anesthesia Type:Spinal  Level of Consciousness: awake, alert  and oriented  Airway & Oxygen Therapy: Patient Spontanous Breathing  Post-op Assessment: Report given to RN and Post -op Vital signs reviewed and stable  Post vital signs: Reviewed and stable  Last Vitals:  Vitals:   07/31/17 2100  BP: 125/79  Pulse: 83  Resp: 19  Temp: 37.1 C    Last Pain:  Vitals:   07/31/17 2100  TempSrc: Oral         Complications: No apparent anesthesia complications

## 2017-07-31 NOTE — Anesthesia Procedure Notes (Signed)
Spinal  Patient location during procedure: OR Start time: 07/31/2017 10:31 PM End time: 07/31/2017 10:34 PM Staffing Anesthesiologist: Lyn Hollingshead Performed: anesthesiologist  Spinal Block Patient position: sitting Prep: site prepped and draped and DuraPrep Patient monitoring: heart rate, cardiac monitor, continuous pulse ox and blood pressure Approach: midline Location: L3-4 Injection technique: single-shot Needle Needle type: Pencan  Needle gauge: 24 G Needle length: 10 cm Needle insertion depth: 6 cm Assessment Sensory level: T6

## 2017-07-31 NOTE — Op Note (Signed)
Cesarean Section Procedure Note  Indications: previous myomectomy and Active labor  Pre-operative Diagnosis: 37 week 3 day pregnancy.  Post-operative Diagnosis: same and elective sterilization  Surgeon: Lovenia Kim   Assistants: Claudette Laws, CNM  Anesthesia: Local anesthesia 0.25.% bupivacaine and Spinal anesthesia  ASA Class: 2  Procedure Details  The patient was seen in the Holding Room. The risks, benefits, complications, treatment options, and expected outcomes were discussed with the patient.  The patient concurred with the proposed plan, giving informed consent. The risks of anesthesia, infection, bleeding and possible injury to other organs discussed. Injury to bowel, bladder, or ureter with possible need for repair discussed. Possible need for transfusion with secondary risks of hepatitis or HIV acquisition discussed. Post operative complications to include but not limited to DVT, PE and Pneumonia noted. The site of surgery properly noted/marked. The patient was taken to Operating Room # 9, identified as [name] and the procedure verified as C-Section Delivery. A Time Out was held and the above information confirmed.  After induction of anesthesia, the patient was draped and prepped in the usual sterile manner. A Pfannenstiel incision was made and carried down through the subcutaneous tissue to the fascia. Fascial incision was made and extended transversely using Mayo scissors. The fascia was separated from the underlying rectus tissue superiorly and inferiorly. The peritoneum was identified and entered. Peritoneal incision was extended longitudinally. The utero-vesical peritoneal reflection was incised transversely and the bladder flap was sharply freed from the lower uterine segment. A low transverse uterine incision(Kerr hysterotomy) was made. Delivered from OT presentation was a  female with Apgar scores of 8 at one minute and 9 at five minutes. Bulb suctioning gently performed.  Neonatal team in attendance.After the umbilical cord was  Delayed clamped and cut cord blood was obtained for evaluation. The placenta was removed intact and appeared normal. The uterus was curetted with a dry lap pack. Good hemostasis was noted.The uterine outline, tubes and ovaries appeared abnormal due to multiple small SS fibroids and one 5cm right lateral IM fibroid. The uterine incision was closed with running locked sutures of 0 Monocryl x 1 layers. Modified pomeroy tubal ligation x right then left with O plain ties without complications.  Hemostasis was observed. Lavage was carried out until clear.The parietal peritoneum was closed with a running 2-0 Monocryl suture. The fascia was then reapproximated with running sutures of 0 Monocryl. The skin was reapproximated with 3-0 monocryl after Carmichael closure with 2-0 plain. Dilute marcaine and dilute kenalog solution placed.   Instrument, sponge, and needle counts were correct prior the abdominal closure and at the conclusion of the case.   Findings: FTLM, OT, post placenta. Multiple small SS fibroids. Left paratubal cyst. Nl ovaries.;  Estimated Blood Loss:  600         Drains: foley                 Specimens: placenta                 Complications:  None; patient tolerated the procedure well.         Disposition: PACU - hemodynamically stable.         Condition: stable  Attending Attestation: I performed the procedure.

## 2017-08-01 ENCOUNTER — Encounter (HOSPITAL_COMMUNITY): Payer: Self-pay | Admitting: Obstetrics and Gynecology

## 2017-08-01 ENCOUNTER — Inpatient Hospital Stay (HOSPITAL_COMMUNITY)
Admission: RE | Admit: 2017-08-01 | Payer: Commercial Managed Care - PPO | Source: Ambulatory Visit | Admitting: Obstetrics and Gynecology

## 2017-08-01 DIAGNOSIS — O34219 Maternal care for unspecified type scar from previous cesarean delivery: Secondary | ICD-10-CM | POA: Diagnosis present

## 2017-08-01 LAB — CBC
HEMATOCRIT: 32.2 % — AB (ref 36.0–46.0)
Hemoglobin: 11 g/dL — ABNORMAL LOW (ref 12.0–15.0)
MCH: 30.5 pg (ref 26.0–34.0)
MCHC: 34.2 g/dL (ref 30.0–36.0)
MCV: 89.2 fL (ref 78.0–100.0)
PLATELETS: 283 10*3/uL (ref 150–400)
RBC: 3.61 MIL/uL — ABNORMAL LOW (ref 3.87–5.11)
RDW: 17.9 % — AB (ref 11.5–15.5)
WBC: 16.5 10*3/uL — AB (ref 4.0–10.5)

## 2017-08-01 LAB — RPR: RPR Ser Ql: NONREACTIVE

## 2017-08-01 MED ORDER — MEPERIDINE HCL 25 MG/ML IJ SOLN: 6.2500 mg | INTRAMUSCULAR | Status: DC | PRN

## 2017-08-01 MED ORDER — SIMETHICONE 80 MG PO CHEW: 80.0000 mg | CHEWABLE_TABLET | ORAL | Status: DC | PRN

## 2017-08-01 MED ORDER — NALOXONE HCL 0.4 MG/ML IJ SOLN: 0.4000 mg | INTRAMUSCULAR | Status: DC | PRN

## 2017-08-01 MED ORDER — TRIAMCINOLONE ACETONIDE 10 MG/ML IJ SUSP
INTRAMUSCULAR | Status: DC | PRN
  Administered 2017-08-01: 40 mg via INTRAMUSCULAR

## 2017-08-01 MED ORDER — DIPHENHYDRAMINE HCL 50 MG/ML IJ SOLN: 12.5000 mg | INTRAMUSCULAR | Status: DC | PRN

## 2017-08-01 MED ORDER — DIPHENHYDRAMINE HCL 25 MG PO CAPS: 25.0000 mg | ORAL_CAPSULE | ORAL | Status: DC | PRN

## 2017-08-01 MED ORDER — ACETAMINOPHEN 500 MG PO TABS
1000.0000 mg | ORAL_TABLET | Freq: Four times a day (QID) | ORAL | Status: DC
  Filled 2017-08-01 (×2): qty 2

## 2017-08-01 MED ORDER — OXYCODONE-ACETAMINOPHEN 5-325 MG PO TABS: 2.0000 | ORAL_TABLET | ORAL | Status: DC | PRN

## 2017-08-01 MED ORDER — KETOROLAC TROMETHAMINE 30 MG/ML IJ SOLN: 30.0000 mg | Freq: Once | INTRAMUSCULAR | Status: DC | PRN

## 2017-08-01 MED ORDER — DIPHENHYDRAMINE HCL 25 MG PO CAPS: 25.0000 mg | ORAL_CAPSULE | Freq: Four times a day (QID) | ORAL | Status: DC | PRN

## 2017-08-01 MED ORDER — IBUPROFEN 600 MG PO TABS
600.0000 mg | ORAL_TABLET | Freq: Four times a day (QID) | ORAL | Status: DC
  Administered 2017-08-01 – 2017-08-02 (×4): 600 mg via ORAL
  Filled 2017-08-01 (×5): qty 1

## 2017-08-01 MED ORDER — PROMETHAZINE HCL 25 MG/ML IJ SOLN: 6.2500 mg | INTRAMUSCULAR | Status: DC | PRN

## 2017-08-01 MED ORDER — NALBUPHINE HCL 10 MG/ML IJ SOLN: 5.0000 mg | Freq: Once | INTRAMUSCULAR | Status: DC | PRN

## 2017-08-01 MED ORDER — ZOLPIDEM TARTRATE 5 MG PO TABS: 5.0000 mg | ORAL_TABLET | Freq: Every evening | ORAL | Status: DC | PRN

## 2017-08-01 MED ORDER — WITCH HAZEL-GLYCERIN EX PADS: 1.0000 "application " | MEDICATED_PAD | CUTANEOUS | Status: DC | PRN

## 2017-08-01 MED ORDER — METHYLERGONOVINE MALEATE 0.2 MG/ML IJ SOLN: 0.2000 mg | INTRAMUSCULAR | Status: DC | PRN

## 2017-08-01 MED ORDER — COCONUT OIL OIL
1.0000 "application " | TOPICAL_OIL | Status: DC | PRN
  Administered 2017-08-03: 1 via TOPICAL
  Filled 2017-08-01: qty 120

## 2017-08-01 MED ORDER — NALBUPHINE HCL 10 MG/ML IJ SOLN: 5.0000 mg | INTRAMUSCULAR | Status: DC | PRN

## 2017-08-01 MED ORDER — ONDANSETRON HCL 4 MG/2ML IJ SOLN: 4.0000 mg | Freq: Three times a day (TID) | INTRAMUSCULAR | Status: DC | PRN

## 2017-08-01 MED ORDER — METHYLERGONOVINE MALEATE 0.2 MG PO TABS: 0.2000 mg | ORAL_TABLET | ORAL | Status: DC | PRN

## 2017-08-01 MED ORDER — MENTHOL 3 MG MT LOZG: 1.0000 | LOZENGE | OROMUCOSAL | Status: DC | PRN

## 2017-08-01 MED ORDER — SENNOSIDES-DOCUSATE SODIUM 8.6-50 MG PO TABS
2.0000 | ORAL_TABLET | ORAL | Status: DC
  Administered 2017-08-01 – 2017-08-03 (×2): 2 via ORAL
  Filled 2017-08-01 (×2): qty 2

## 2017-08-01 MED ORDER — HYDROMORPHONE HCL 1 MG/ML IJ SOLN: 0.2500 mg | INTRAMUSCULAR | Status: DC | PRN

## 2017-08-01 MED ORDER — SODIUM CHLORIDE 0.9% FLUSH: 3.0000 mL | INTRAVENOUS | Status: DC | PRN

## 2017-08-01 MED ORDER — NALOXONE HCL 2 MG/2ML IJ SOSY: 1.0000 ug/kg/h | PREFILLED_SYRINGE | INTRAVENOUS | Status: DC | PRN

## 2017-08-01 MED ORDER — DIBUCAINE 1 % RE OINT: 1.0000 "application " | TOPICAL_OINTMENT | RECTAL | Status: DC | PRN

## 2017-08-01 MED ORDER — OXYCODONE-ACETAMINOPHEN 5-325 MG PO TABS
1.0000 | ORAL_TABLET | ORAL | Status: DC | PRN
Start: 2017-08-01 — End: 2017-08-03
  Administered 2017-08-02 (×2): 1 via ORAL
  Filled 2017-08-01 (×2): qty 1

## 2017-08-01 MED ORDER — SCOPOLAMINE 1 MG/3DAYS TD PT72
1.0000 | MEDICATED_PATCH | Freq: Once | TRANSDERMAL | Status: DC
  Filled 2017-08-01: qty 1

## 2017-08-01 MED ORDER — KETOROLAC TROMETHAMINE 30 MG/ML IJ SOLN
INTRAMUSCULAR | Status: AC
  Administered 2017-08-01: 30 mg via INTRAMUSCULAR
  Filled 2017-08-01: qty 1

## 2017-08-01 MED ORDER — SIMETHICONE 80 MG PO CHEW
80.0000 mg | CHEWABLE_TABLET | Freq: Three times a day (TID) | ORAL | Status: DC
  Administered 2017-08-01 – 2017-08-03 (×5): 80 mg via ORAL
  Filled 2017-08-01 (×5): qty 1

## 2017-08-01 MED ORDER — PRENATAL MULTIVITAMIN CH
1.0000 | ORAL_TABLET | Freq: Every day | ORAL | Status: DC
  Administered 2017-08-02: 1 via ORAL
  Filled 2017-08-01: qty 1

## 2017-08-01 MED ORDER — TETANUS-DIPHTH-ACELL PERTUSSIS 5-2.5-18.5 LF-MCG/0.5 IM SUSP: 0.5000 mL | Freq: Once | INTRAMUSCULAR | Status: DC

## 2017-08-01 MED ORDER — ACETAMINOPHEN 325 MG PO TABS
650.0000 mg | ORAL_TABLET | ORAL | Status: DC | PRN
  Administered 2017-08-02 (×2): 650 mg via ORAL
  Filled 2017-08-01 (×2): qty 2

## 2017-08-01 MED ORDER — LACTATED RINGERS IV SOLN: INTRAVENOUS | Status: DC

## 2017-08-01 MED ORDER — KETOROLAC TROMETHAMINE 30 MG/ML IJ SOLN
30.0000 mg | Freq: Four times a day (QID) | INTRAMUSCULAR | Status: DC | PRN
  Administered 2017-08-01: 30 mg via INTRAMUSCULAR

## 2017-08-01 MED ORDER — OXYTOCIN 40 UNITS IN LACTATED RINGERS INFUSION - SIMPLE MED: 2.5000 [IU]/h | INTRAVENOUS | Status: AC

## 2017-08-01 MED ORDER — SIMETHICONE 80 MG PO CHEW
80.0000 mg | CHEWABLE_TABLET | ORAL | Status: DC
  Administered 2017-08-01 – 2017-08-03 (×2): 80 mg via ORAL
  Filled 2017-08-01 (×2): qty 1

## 2017-08-01 MED ORDER — KETOROLAC TROMETHAMINE 30 MG/ML IJ SOLN: 30.0000 mg | Freq: Four times a day (QID) | INTRAMUSCULAR | Status: DC | PRN

## 2017-08-01 NOTE — Anesthesia Postprocedure Evaluation (Signed)
Anesthesia Post Note  Patient: [name]  Procedure(s) Performed: Procedure(s) (LRB): CESAREAN SECTION WITH BILATERAL TUBAL LIGATION (N/A)     Patient location during evaluation: PACU Anesthesia Type: Spinal Level of consciousness: awake Pain management: pain level controlled Vital Signs Assessment: post-procedure vital signs reviewed and stable Respiratory status: spontaneous breathing Cardiovascular status: stable Postop Assessment: no headache, no backache, spinal receding, patient able to bend at knees and no signs of nausea or vomiting Anesthetic complications: no    Last Vitals:  Vitals:   08/01/17 0000 08/01/17 0015  BP: 108/67 110/68  Pulse: 77 74  Resp: 18 16  Temp:  36.5 C  SpO2: 100% 98%    Last Pain:  Vitals:   08/01/17 0015  TempSrc: Oral  PainSc: 0-No pain   Pain Goal:                 Doyce Stonehouse JR,JOHN Laker Thompson

## 2017-08-01 NOTE — Progress Notes (Signed)
Patient complains of feeling nauseous and vomited clear liquid x1 on the blanket and the bed.  VS are WNL BP 110/68, Pulse 71, Temp. 98.1.  Patient prefer to close her eyes and rest.  Refused medication for N/V at this time.

## 2017-08-01 NOTE — Lactation Note (Addendum)
This note was copied from a baby's chart. Lactation Consultation Note  Patient Name: [name] [id number] Date: 08/01/2017 Reason for consult: Infant < 6lbs;Initial assessment   P3, Ex BF 8 and 6 months.  [redacted]w[redacted]d < 6 lbs. Last child was LPI and mother has experience with LPI plan. Reviewed hand expression.  Slight drops given to baby  Baby has been sleepy and has not fed since this morning. Unwrapped baby for feeding.  Mother latched baby, repositioned baby to cross cradle. Baby sleepy but intermittent sucks and a few swallows observed. Set up DEBP.  Recommend mother post pump 4-6 times per day for 10-20 min. Give volume pumped back to baby. Discussed LPI feeding plan.   Discussed spoon and syringe feeding.  Suggest mother call after pumping depending on volume pumped for assistance w/ supplementation. Reviewed LPI volume guidelines. Mom encouraged to feed baby 8-12 times/24 hours and with feeding cues at least q 3 hours. Mom made aware of O/P services, breastfeeding support groups, community resources, and our phone # for post-discharge questions.      Maternal Data Has patient been taught Hand Expression?: Yes Does the patient have breastfeeding experience prior to this delivery?: Yes  Feeding Feeding Type: Breast Fed  LATCH Score Latch: Repeated attempts needed to sustain latch, nipple held in mouth throughout feeding, stimulation needed to elicit sucking reflex.  Audible Swallowing: A few with stimulation  Type of Nipple: Everted at rest and after stimulation  Comfort (Breast/Nipple): Soft / non-tender  Hold (Positioning): Assistance needed to correctly position infant at breast and maintain latch.  LATCH Score: 7  Interventions Interventions: Assisted with latch;Skin to skin;Hand express;Breast compression;Adjust position;Support pillows;Expressed milk;DEBP  Lactation Tools Discussed/Used Pump Review: Setup, frequency, and cleaning Initiated by:: [name] RN IBCLC Date initiated:: 08/01/17   Consult Status Consult Status: Follow-up Date: 08/02/17 Follow-up type: In-patient    [name] [facility] 08/01/2017, 1:48 PM

## 2017-08-02 MED ORDER — IBUPROFEN 800 MG PO TABS
800.0000 mg | ORAL_TABLET | Freq: Three times a day (TID) | ORAL | Status: DC
  Administered 2017-08-02 – 2017-08-03 (×3): 800 mg via ORAL
  Filled 2017-08-02 (×3): qty 1

## 2017-08-02 NOTE — Plan of Care (Signed)
Problem: Nutritional: Goal: Dietary intake will improve Outcome: Completed/Met Date Met: 08/02/17 Patient tolerating regular diet

## 2017-08-02 NOTE — Lactation Note (Signed)
This note was copied from a baby's chart. Lactation Consultation Note  Patient Name: [name] [id number] Date: 08/02/2017 Reason for consult: Follow-up assessment;Early term 37-38.6wks;Infant < 6lbs  Visited with Mom, baby 24 hrs old, baby at 2.8% weight loss.  Mom had baby sleeping on her chest.  Reviewed importance of regular double pumping.  Mom states she has only pumped once today.  Explained importance of regular pumping, breast massage, and hand expression to support a full milk supply.  Encouraged STS as much as possible. Mom states that baby latches, and has fed for 15 mins, but typically falls asleep after about 5 mins.  Mom aware of importance of a deep latch onto breast.  Offered a latch assist/assessment, and advised Mom to call for next feeding. Mom to continue offering EBM+/formula per volume guidelines.  Baby took 25 ml at last 2 feedings.  Reviewed paced bottle feeding.  Mom told about supplementation at the breast option, offered to assist at next feeding.  Mom declined, saying she would stay with bottle feeding.  Mom made aware of importance of follow-up after discharge with an OP lactation appointment.    To follow up at next feeding for latch assist.  Consult Status Consult Status: Follow-up Date: 08/03/17 Follow-up type: In-patient    Broadus John 08/02/2017, 2:34 PM

## 2017-08-02 NOTE — Progress Notes (Signed)
POSTOPERATIVE DAY # 2 S/P CS  S:         Reports feeling sore today a lot of aching with movement             Tolerating po intake / no nausea / no vomiting / + flatus / no BM             Bleeding is light             Pain controlled with motrin             Up ad lib / ambulatory/ voiding QS  Newborn breast feeding   O:  VS: BP (!) 106/58 (BP Location: Right Arm)   Pulse 70   Temp 97.8 F (36.6 C) (Oral)   Resp 18   SpO2 98%   Breastfeeding? Unknown    LABS:               Recent Labs  07/31/17 2152 08/01/17 0301  WBC 11.5* 16.5*  HGB 11.2* 11.0*  PLT 319 283               Bloodtype: --/--/O POS (09/04 2152)  Rubella: Immune (02/21 0000)                                         I&O: Intake/Output      09/05 0701 - 09/06 0700 09/06 0701 - 09/07 0700   Urine 775    Total Output 775     Net -775                     Physical Exam:             Alert and Oriented X3  Lungs: Clear and unlabored  Heart: regular rate and rhythm / no mumurs  Abdomen: soft, non-tender, mildly distended, active BS, residual pressure dressing still in place lateral to incision              Fundus: firm, non-tender, Ueven             Dressing intact honeycomb              Incision:  approximated with suture / no erythema / no ecchymosis / dried marked drainage  Perineum: intact  Lochia: light  Extremities: trace edema, no calf pain or tenderness, negative Homans  A:        POD # 2 S/P CS             P:        Routine postoperative care              Remove remainder of pressure dressing             Ambulate / warm shower / warm fluids to promote bowel motility   Artelia Laroche CNM, MSN, [place] 08/02/2017, 11:04 AM

## 2017-08-03 MED ORDER — IBUPROFEN 800 MG PO TABS: 800.0000 mg | ORAL_TABLET | Freq: Three times a day (TID) | ORAL | 0 refills | Status: DC

## 2017-08-03 NOTE — Lactation Note (Signed)
This note was copied from a baby's chart. Lactation Consultation Note  Patient Name: [name] [id number] Date: 08/03/2017 Reason for consult: Follow-up assessment;Infant weight loss;Early term 37-38.6wks;Infant < 6lbs (1% weight loss/ per mom baby recently fed at the breast and supplemented )  Baby is 60 hours old  As LC walked in the room , mom resting bed and holding the baby , sound asleep.  LC reviewed doc flow sheets and updated per mom Per mom baby breast fed and was able to soften the one breast well.  Mom denies soreness. Sore nipple and engorgement prevention and tx reviewed.  Per mom will have a [place] at home.  LC reviewed potential feeding behaviors of an ET infant. Also discussed If the baby feeds  At the breast 1st , supplement with EBM or formula 30 ml. And post pump .  If the baby is really sluggish to feed and it has been 3 hours - try appetizer of EBM or formula 5-10 ml  And then feed the baby. If still sluggish . Release off the breast and finish the baby with the bottle 30-45 ml.  Post pump both breast for 15 -20 mins. ( protect estabishing milk supply )  Mom is familiar with these early babies , had a 57 week infant last year.  Mother informed of post-discharge support and given phone number to the lactation department, including services for phone call assistance; out-patient appointments; and breastfeeding support group. List of other breastfeeding resources in the community given in the handout. Encouraged mother to call for problems or concerns related to breastfeeding.  LC offered mom and LC O/P appt. In 5-7 days and mom requested to be able to call back.  Mom aware of the clinics phone number and location.     Maternal Data    Feeding Feeding Type:  (pe rmom baby recently  breast fed ) Length of feed: 20 min (per mom )  LATCH Score                   Interventions Interventions: Breast feeding basics reviewed  Lactation Tools  Discussed/Used Tools: Pump Breast pump type: Double-Electric Breast Pump   Consult Status Consult Status: Complete (LC offered mom and LC O/P appt in 5-7 days and mom requested to call back , has the number ) Date: 08/03/17    [place] 08/03/2017, 9:59 AM

## 2017-08-03 NOTE — Discharge Summary (Signed)
Obstetric Discharge Summary   Patient Name: [name] DOB: [birth year] MRN: [id number]  Date of Admission: 07/31/2017 Date of Discharge: 08/03/2017 Date of Delivery: 07/31/17 Gestational Age at Delivery: [redacted]w[redacted]d  Primary OB: [facility] - Dr. Garwin Brothers  Antepartum complications:  - AMA - Hx. Of uterine fibroids - Hx. Of myomectomy - Obesity - Hx. Of cesarean section - Hx. Of abnormal pap smear s/p cryotherapy - Hx. Of Preterm delivery - s/p Makena injections until 36 weeks Prenatal Labs:  ABO, Rh: --/--/O POS (09/04 1200) Antibody: NEG (09/04 1200) Rubella: Immune (02/21 0000) RPR: Nonreactive (02/21 0000)  HBsAg: Negative (02/21 0000)  HIV: Non-reactive (02/21 0000)  GBS: none  Admitting Diagnosis: 37 weeks, early labor, repeat c/s and BTL desired  Secondary Diagnoses: Patient Active Problem List   Diagnosis Date Noted  . Previous cesarean delivery affecting pregnancy 08/01/2017  . S/P cesarean section: Indication - repeat, labor, hx. of myomectomy  07/31/2017  . Postpartum care following cesarean delivery (9/4) 07/31/2017   Date of Delivery: 07/31/17 Delivered By: Dr. Ezekiel Slocumb. [place], CNM assist Delivery Type: repeat cesarean section, low transverse incision  Newborn Data: Live born female  Birth Weight: 5 lb 14.7 oz (2685 g) APGAR: 9, 10  Postpartum Course  (Cesarean Section):  Patient had an uncomplicated postpartum course.  By time of discharge on POD#3, her pain was controlled on oral pain medications; she had appropriate lochia and was ambulating, voiding without difficulty, tolerating regular diet and passing flatus.   She was deemed stable for discharge to home.     Labs: CBC Latest Ref Rng & Units 08/01/2017 07/31/2017 07/31/2017  WBC 4.0 - 10.5 K/uL 16.5(H) 11.5(H) 10.2  Hemoglobin 12.0 - 15.0 g/dL 11.0(L) 11.2(L) 11.2(L)  Hematocrit 36.0 - 46.0 % 32.2(L) 33.1(L) 32.9(L)  Platelets 150 - 400 K/uL 283 319 326   O POS  Physical exam:  BP 115/68 (BP  Location: Right Arm)   Pulse 62   Temp 97.8 F (36.6 C) (Oral)   Resp 16   SpO2 98%   Breastfeeding? Unknown  General: alert and no distress Pulm: normal respiratory effort Lochia: appropriate Abdomen: soft, NT Uterine Fundus: firm, below umbilicus Perineum: healing well, no significant erythema, no significant edema Incision: c/d/i, healing well, no significant drainage, no dehiscence, no significant erythema Extremities: No evidence of DVT seen on physical exam. Trace lower extremity edema.   Disposition: stable, discharge to home Baby Feeding: breast milk and formula Baby Disposition: home with mom  Contraception: s/p Bilateral Tubal Ligation  Rh Immune globulin given: N/A Rubella vaccine given: N/A Tdap vaccine given in AP or PP setting: not on file Flu vaccine given in AP or PP setting: not on file   Plan:  [name] was discharged to home in good condition. Follow-up appointment at [facility] in 6 weeks.  Discharge Instructions: Per After Visit Summary. Activity: Advance as tolerated. Pelvic rest for 6 weeks.  Refer to After Visit Summary Diet: Regular, Heart Healthy Discharge Medications: Allergies as of 08/03/2017      Reactions   Latex Itching   Irritation    Soybean-containing Drug Products Hives, Itching      Medication List    TAKE these medications   FERREX 150 PO Take 1 capsule by mouth at bedtime.   prenatal multivitamin Tabs tablet Take 1 tablet by mouth at bedtime.            Discharge Care Instructions        Start  Ordered   08/03/17 0000  Diet - low sodium heart healthy     08/03/17 1036   08/03/17 0000  Call MD for:  extreme fatigue     08/03/17 1036   08/03/17 0000  Call MD for:  persistant dizziness or light-headedness     08/03/17 1036   08/03/17 0000  Call MD for:  hives     08/03/17 1036   08/03/17 0000  Call MD for:  difficulty breathing, headache or visual disturbances     08/03/17 1036   08/03/17 0000   Call MD for:  redness, tenderness, or signs of infection (pain, swelling, redness, odor or green/yellow discharge around incision site)     08/03/17 1036   08/03/17 0000  Call MD for:  severe uncontrolled pain     08/03/17 1036   08/03/17 0000  Call MD for:  persistant nausea and vomiting     08/03/17 1036   08/03/17 0000  Call MD for:  temperature >100.4     08/03/17 1036   08/03/17 0000  Call MD for:     08/03/17 1036   08/03/17 0000  Driving restriction     Comments:  Avoid driving for 2 weeks.   08/03/17 1036   08/03/17 0000  Lifting restrictions    Comments:  Weight restriction of 20-25 lbs.   08/03/17 1036   08/03/17 0000  Sexual acrtivity    Comments:  No intercourse for 6 weeks   08/03/17 1036   08/03/17 0000  No dressing needed     08/03/17 1036   08/03/17 0000  Discharge wound care:    Comments:  Keep incision dry. Steri strips will come off when ready. Leave Dermabond skin glue on.   08/03/17 1036     Outpatient follow up:  Follow-up Information    Servando Salina, MD. Schedule an appointment as soon as possible for a visit in 6 week(s).   Specialty:  Obstetrics and Gynecology Why:  Postpartum visit Contact information: [address] [place] [address] [phone]           Signed:  Lars Pinks, MSN, CNM [facility]

## 2017-08-03 NOTE — Progress Notes (Signed)
POSTOPERATIVE DAY # 3 S/P Repeat LTCS and bilateral tubal ligation   S:         Reports feeling good, and ready to be discharged home today             Tolerating po intake / no nausea / no vomiting / + flatus / no BM  Denies dizziness, SOB, or CP             Bleeding is light             Pain controlled with Motrin and Percocet - pt. declined need for Percocet at home             Up ad lib / ambulatory/ voiding QS  Newborn breast feeding going well, states her milk has come in, with formula supplementation / Circumcision - done   O:  VS: BP 115/68 (BP Location: Right Arm)   Pulse 62   Temp 97.8 F (36.6 C) (Oral)   Resp 16   SpO2 98%   Breastfeeding? Unknown    LABS:               Recent Labs  07/31/17 2152 08/01/17 0301  WBC 11.5* 16.5*  HGB 11.2* 11.0*  PLT 319 283               Bloodtype: --/--/O POS (09/04 2152)  Rubella: Immune (02/21 0000)                                                      Physical Exam:             Alert and Oriented X3  Lungs: Clear and unlabored  Heart: regular rate and rhythm / no mumurs  Abdomen: soft, non-tender, non-distended, active bowel sounds in all quadrants             Fundus: firm, non-tender, U-2             Dressing: steri-strips c/d/i              Incision:  approximated with sutures / no erythema / no ecchymosis / no drainage  Perineum: intact  Lochia: appropriate, scant  Extremities: no edema, no calf pain or tenderness  A:        POD # 3 S/P Repeat LTCS and bilateral tubal ligation            Uterine fibroids with Hx. Of myomectomy  P:        Routine postoperative care              Discharge home today  WOB discharge book and instructions given  F/u with Dr. Garwin Brothers in 6 weeks   Lars Pinks, MSN, CNM [facility]

## 2017-08-03 NOTE — Progress Notes (Signed)
Patient removed honeycomb dressing in shower while trying to remove additional adhesive tape. Reported to Artelia Laroche CNM. Provider does not want new dressing at this time. Will leave open to air.

## 2019-01-21 ENCOUNTER — Other Ambulatory Visit (HOSPITAL_COMMUNITY): Payer: Self-pay | Admitting: Gastroenterology

## 2019-01-21 ENCOUNTER — Other Ambulatory Visit: Payer: Self-pay | Admitting: Gastroenterology

## 2019-01-21 DIAGNOSIS — R1033 Periumbilical pain: Secondary | ICD-10-CM

## 2019-01-30 ENCOUNTER — Ambulatory Visit (HOSPITAL_COMMUNITY)
Admission: RE | Admit: 2019-01-30 | Discharge: 2019-01-30 | Disposition: A | Discharge status: Home / Self Care | Payer: Commercial Managed Care - PPO | Source: Ambulatory Visit | Attending: Gastroenterology | Admitting: Gastroenterology

## 2019-01-30 ENCOUNTER — Encounter (HOSPITAL_COMMUNITY): Payer: Self-pay | Admitting: Radiology

## 2019-01-30 DIAGNOSIS — R1033 Periumbilical pain: Secondary | ICD-10-CM | POA: Diagnosis present

## 2019-01-30 MED ORDER — IOHEXOL 300 MG/ML  SOLN
100.0000 mL | Freq: Once | INTRAMUSCULAR | Status: AC | PRN
  Administered 2019-01-30: 100 mL via INTRAVENOUS

## 2020-01-07 DIAGNOSIS — Z1231 Encounter for screening mammogram for malignant neoplasm of breast: Secondary | ICD-10-CM | POA: Diagnosis not present

## 2020-01-07 DIAGNOSIS — Z01419 Encounter for gynecological examination (general) (routine) without abnormal findings: Secondary | ICD-10-CM | POA: Diagnosis not present

## 2020-01-07 DIAGNOSIS — Z6832 Body mass index (BMI) 32.0-32.9, adult: Secondary | ICD-10-CM | POA: Diagnosis not present

## 2020-01-07 DIAGNOSIS — Z1151 Encounter for screening for human papillomavirus (HPV): Secondary | ICD-10-CM | POA: Diagnosis not present

## 2020-01-19 DIAGNOSIS — M9901 Segmental and somatic dysfunction of cervical region: Secondary | ICD-10-CM | POA: Diagnosis not present

## 2020-01-19 DIAGNOSIS — M62830 Muscle spasm of back: Secondary | ICD-10-CM | POA: Diagnosis not present

## 2020-01-19 DIAGNOSIS — M9902 Segmental and somatic dysfunction of thoracic region: Secondary | ICD-10-CM | POA: Diagnosis not present

## 2020-01-19 DIAGNOSIS — R519 Headache, unspecified: Secondary | ICD-10-CM | POA: Diagnosis not present

## 2020-01-26 DIAGNOSIS — M9902 Segmental and somatic dysfunction of thoracic region: Secondary | ICD-10-CM | POA: Diagnosis not present

## 2020-01-26 DIAGNOSIS — M9901 Segmental and somatic dysfunction of cervical region: Secondary | ICD-10-CM | POA: Diagnosis not present

## 2020-01-26 DIAGNOSIS — M62830 Muscle spasm of back: Secondary | ICD-10-CM | POA: Diagnosis not present

## 2020-01-26 DIAGNOSIS — R519 Headache, unspecified: Secondary | ICD-10-CM | POA: Diagnosis not present

## 2020-03-04 DIAGNOSIS — M9901 Segmental and somatic dysfunction of cervical region: Secondary | ICD-10-CM | POA: Diagnosis not present

## 2020-03-04 DIAGNOSIS — M9902 Segmental and somatic dysfunction of thoracic region: Secondary | ICD-10-CM | POA: Diagnosis not present

## 2020-03-04 DIAGNOSIS — R519 Headache, unspecified: Secondary | ICD-10-CM | POA: Diagnosis not present

## 2020-03-04 DIAGNOSIS — M62830 Muscle spasm of back: Secondary | ICD-10-CM | POA: Diagnosis not present

## 2020-03-25 DIAGNOSIS — M9902 Segmental and somatic dysfunction of thoracic region: Secondary | ICD-10-CM | POA: Diagnosis not present

## 2020-03-25 DIAGNOSIS — M62830 Muscle spasm of back: Secondary | ICD-10-CM | POA: Diagnosis not present

## 2020-03-25 DIAGNOSIS — R519 Headache, unspecified: Secondary | ICD-10-CM | POA: Diagnosis not present

## 2020-03-25 DIAGNOSIS — M9901 Segmental and somatic dysfunction of cervical region: Secondary | ICD-10-CM | POA: Diagnosis not present

## 2020-06-07 ENCOUNTER — Ambulatory Visit: Payer: Self-pay | Attending: Internal Medicine

## 2020-06-07 DIAGNOSIS — Z23 Encounter for immunization: Secondary | ICD-10-CM

## 2020-06-07 NOTE — Progress Notes (Signed)
   Covid-19 Vaccination Clinic  Name:  [name]    MRN: [id number] DOB: [birth year]  06/07/2020  [name] was observed post Covid-19 immunization for 15 minutes without incident. She was provided with Vaccine Information Sheet and instruction to access the V-Safe system.   [name] was instructed to call 911 with any severe reactions post vaccine: [name] Kitchen Difficulty breathing  . Swelling of face and throat  . A fast heartbeat  . A bad rash all over body  . Dizziness and weakness   Immunizations Administered    Name Date Dose VIS Date Route   JANSSEN COVID-19 VACCINE 06/07/2020  9:48 AM 0.5 mL 01/24/2020 Intramuscular   Manufacturer: [employer]   Lot: [id number]   [place]: [id number]

## 2020-06-16 DIAGNOSIS — R519 Headache, unspecified: Secondary | ICD-10-CM | POA: Diagnosis not present

## 2020-06-16 DIAGNOSIS — M9902 Segmental and somatic dysfunction of thoracic region: Secondary | ICD-10-CM | POA: Diagnosis not present

## 2020-06-16 DIAGNOSIS — M62830 Muscle spasm of back: Secondary | ICD-10-CM | POA: Diagnosis not present

## 2020-06-16 DIAGNOSIS — M9901 Segmental and somatic dysfunction of cervical region: Secondary | ICD-10-CM | POA: Diagnosis not present

## 2020-08-10 DIAGNOSIS — Z20822 Contact with and (suspected) exposure to covid-19: Secondary | ICD-10-CM | POA: Diagnosis not present

## 2020-08-10 DIAGNOSIS — Z9189 Other specified personal risk factors, not elsewhere classified: Secondary | ICD-10-CM | POA: Diagnosis not present

## 2020-09-21 DIAGNOSIS — M9902 Segmental and somatic dysfunction of thoracic region: Secondary | ICD-10-CM | POA: Diagnosis not present

## 2020-09-21 DIAGNOSIS — M62830 Muscle spasm of back: Secondary | ICD-10-CM | POA: Diagnosis not present

## 2020-09-21 DIAGNOSIS — R519 Headache, unspecified: Secondary | ICD-10-CM | POA: Diagnosis not present

## 2020-09-21 DIAGNOSIS — M9901 Segmental and somatic dysfunction of cervical region: Secondary | ICD-10-CM | POA: Diagnosis not present

## 2020-10-26 DIAGNOSIS — Z20822 Contact with and (suspected) exposure to covid-19: Secondary | ICD-10-CM | POA: Diagnosis not present

## 2020-11-03 DIAGNOSIS — M9901 Segmental and somatic dysfunction of cervical region: Secondary | ICD-10-CM | POA: Diagnosis not present

## 2020-11-03 DIAGNOSIS — M9902 Segmental and somatic dysfunction of thoracic region: Secondary | ICD-10-CM | POA: Diagnosis not present

## 2020-11-03 DIAGNOSIS — R519 Headache, unspecified: Secondary | ICD-10-CM | POA: Diagnosis not present

## 2020-11-03 DIAGNOSIS — M62830 Muscle spasm of back: Secondary | ICD-10-CM | POA: Diagnosis not present

## 2020-11-10 DIAGNOSIS — Z20822 Contact with and (suspected) exposure to covid-19: Secondary | ICD-10-CM | POA: Diagnosis not present

## 2020-11-10 DIAGNOSIS — Z9189 Other specified personal risk factors, not elsewhere classified: Secondary | ICD-10-CM | POA: Diagnosis not present

## 2020-11-30 DIAGNOSIS — R519 Headache, unspecified: Secondary | ICD-10-CM | POA: Diagnosis not present

## 2020-11-30 DIAGNOSIS — M9902 Segmental and somatic dysfunction of thoracic region: Secondary | ICD-10-CM | POA: Diagnosis not present

## 2020-11-30 DIAGNOSIS — M9901 Segmental and somatic dysfunction of cervical region: Secondary | ICD-10-CM | POA: Diagnosis not present

## 2020-11-30 DIAGNOSIS — M62830 Muscle spasm of back: Secondary | ICD-10-CM | POA: Diagnosis not present

## 2020-12-01 DIAGNOSIS — H5213 Myopia, bilateral: Secondary | ICD-10-CM | POA: Diagnosis not present

## 2020-12-02 IMAGING — CT CT ABD-PELV W/ CM
2 of 4 series · 16 of 46 positions shown, 18 images · IV contrast (APPLIED)
Comparison: None.

CLINICAL DATA: Patient with umbilical pain.  Constipation.

EXAM:
CT ABDOMEN AND PELVIS WITH CONTRAST
TECHNIQUE: Multidetector CT imaging of the abdomen and pelvis was performed
using the standard protocol following bolus administration of
intravenous contrast.
CONTRAST:  100mL OMNIPAQUE IOHEXOL 300 MG/ML  SOLN

[Series 3: abd/ pelvis 5.0 i30f 2 · axial · 0.79mm/px · z∈[+303,+748]mm · 13 of 99 slices shown, 15 images]
[im 5/99  soft-tissue]
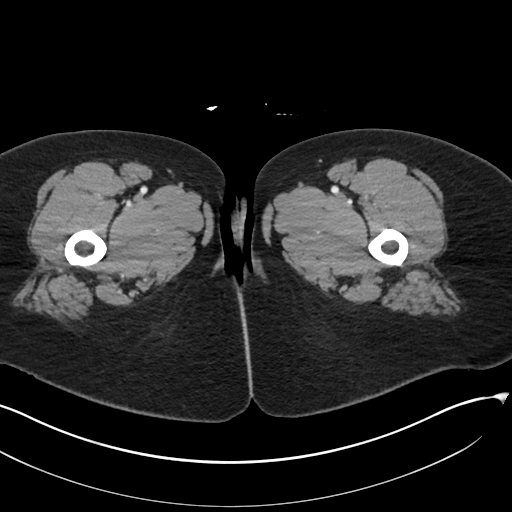
[im 5/99  bone]
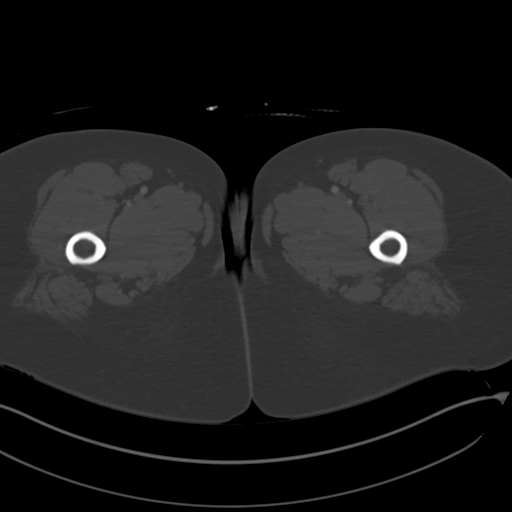
[im 13/99  soft-tissue]
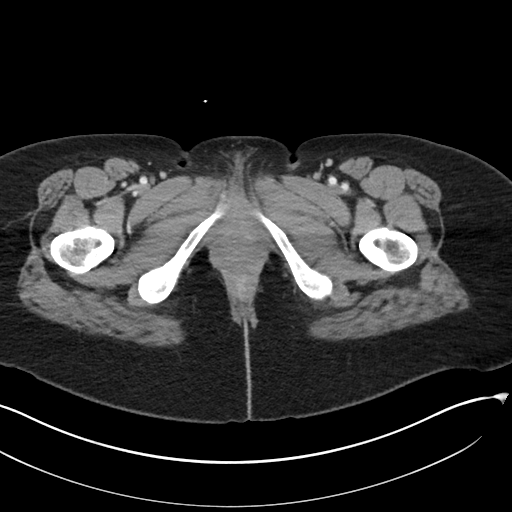
[im 21/99  soft-tissue]
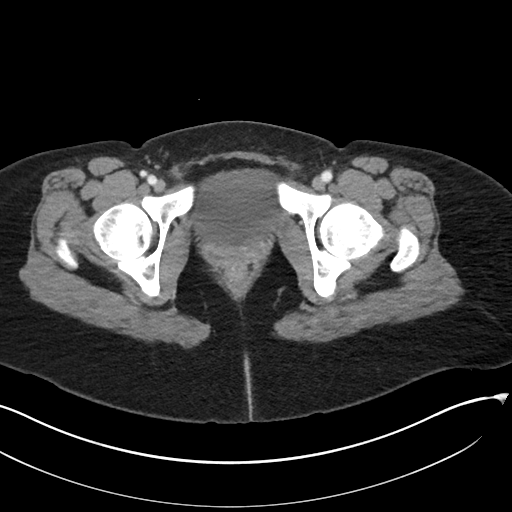
[im 29/99  soft-tissue]
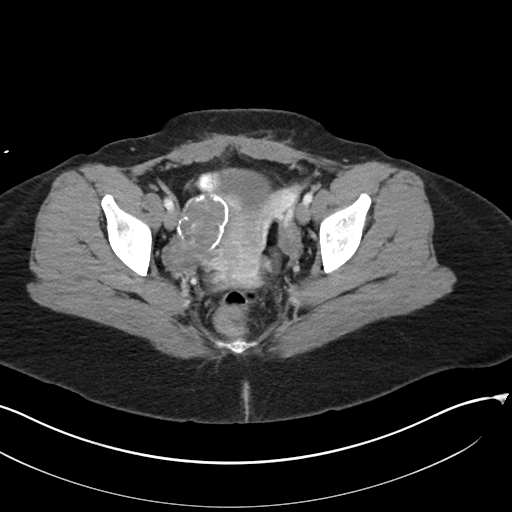
[im 33/99  soft-tissue]
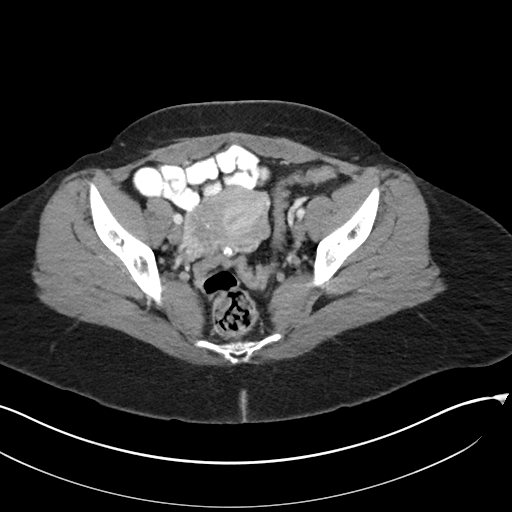
[im 41/99  soft-tissue]
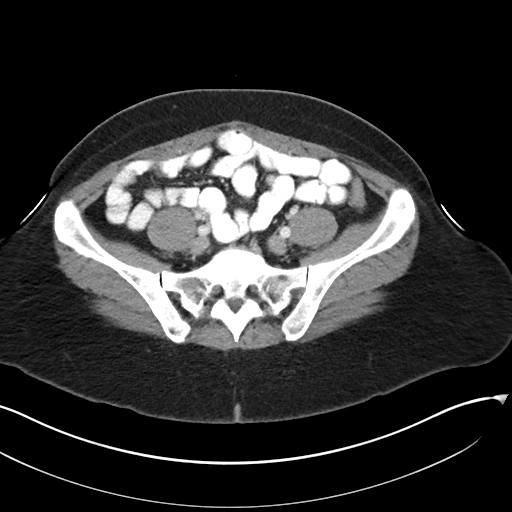
[im 50/99  soft-tissue]
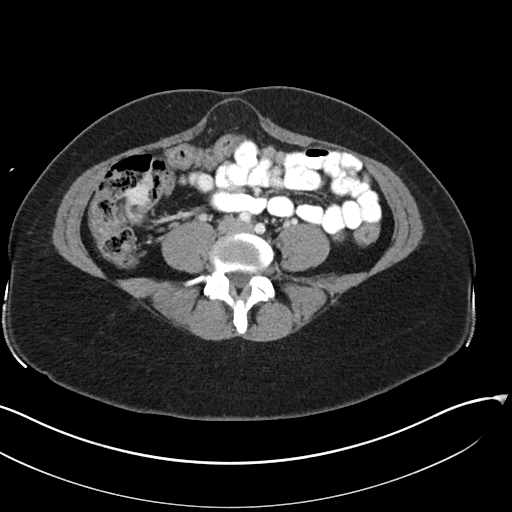
[im 58/99  soft-tissue]
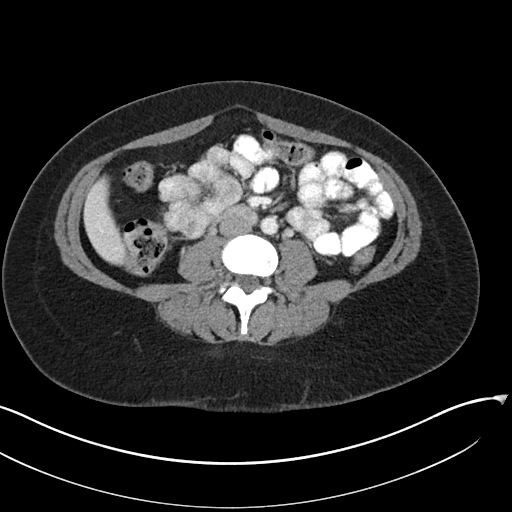
[im 66/99  soft-tissue]
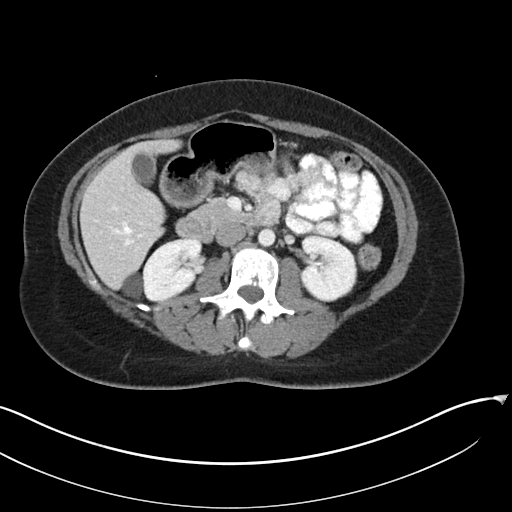
[im 66/99  bone]
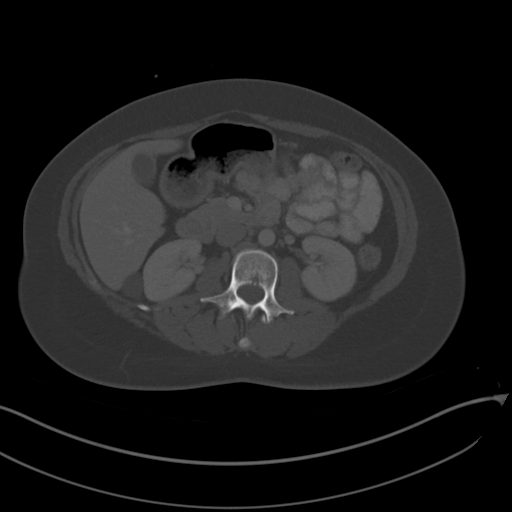
[im 70/99  soft-tissue]
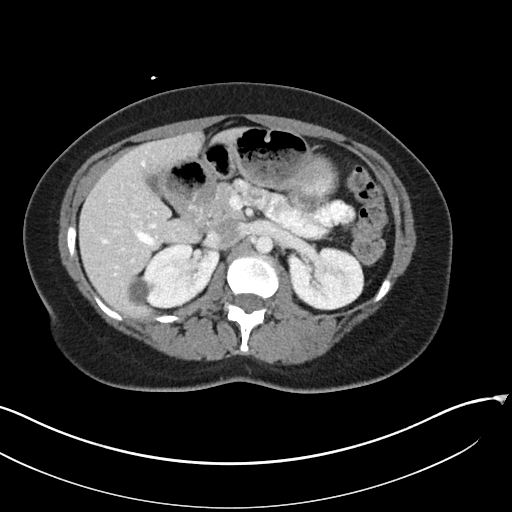
[im 78/99  soft-tissue]
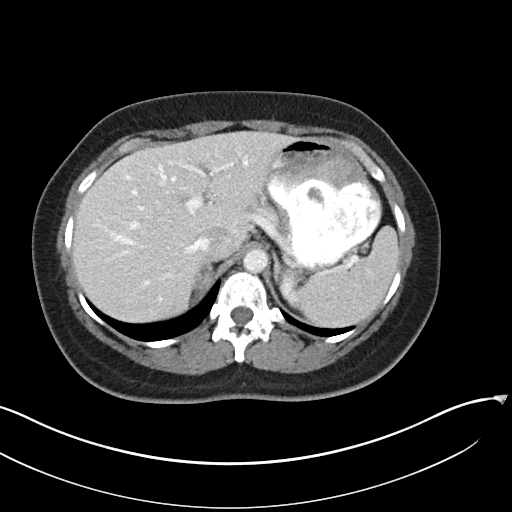
[im 86/99  soft-tissue]
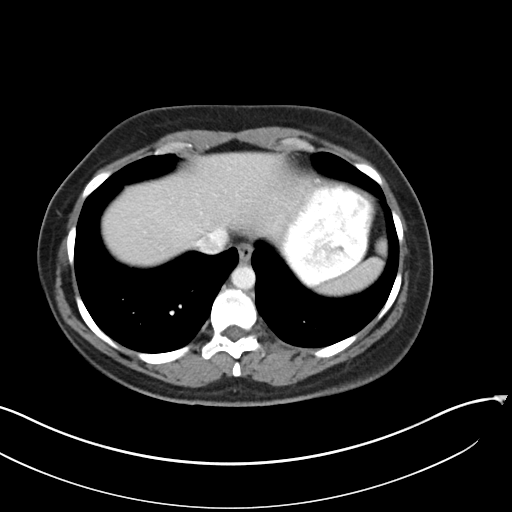
[im 94/99  soft-tissue]
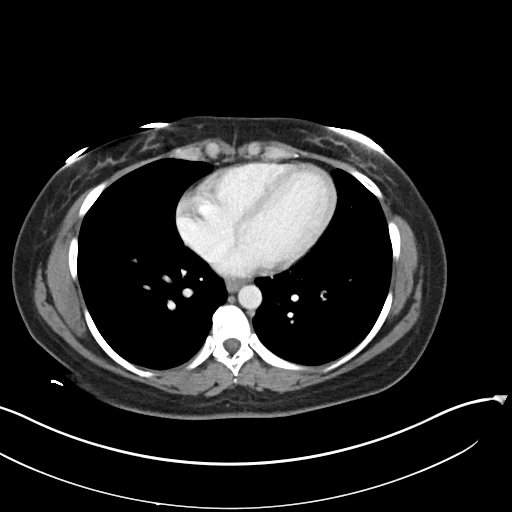

[Series 6: coronal soft tissue · coronal · 0.90mm/px · 3 of 100 slices shown]
[im 34/100  soft-tissue]
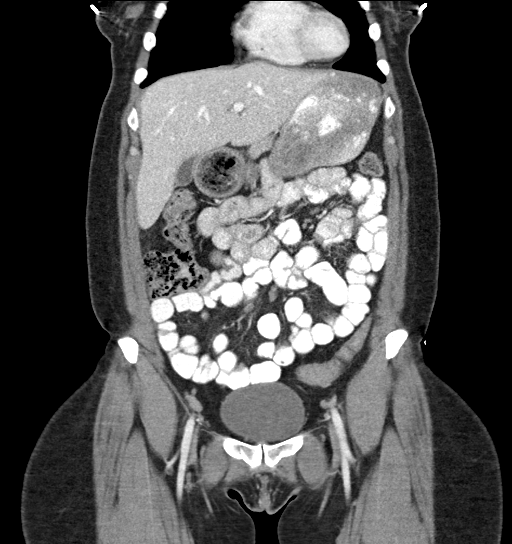
[im 45/100  soft-tissue]
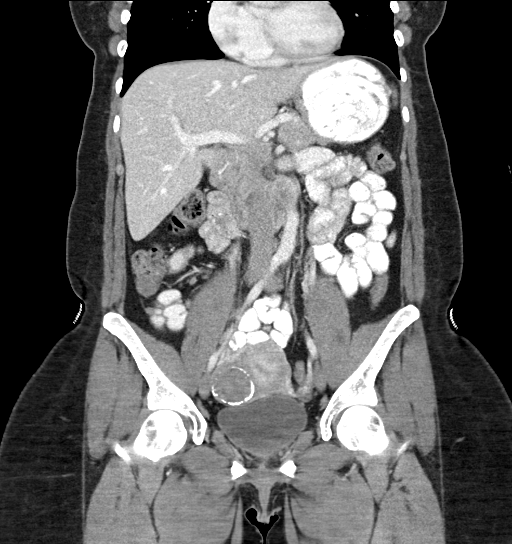
[im 56/100  soft-tissue]
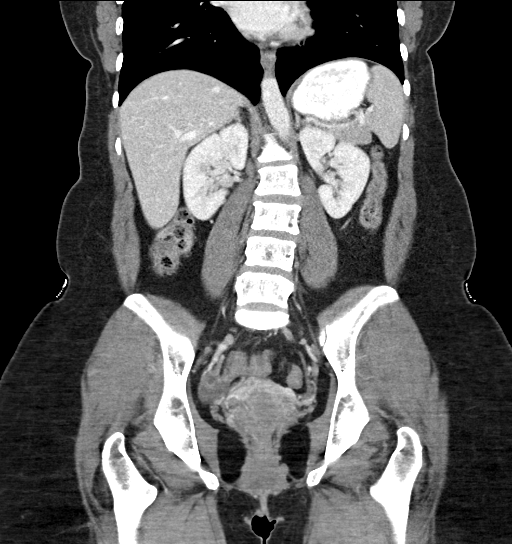

[16 of 46 positions shown; findings below may reference images not displayed]

FINDINGS: Lower chest: Normal heart size. Lung bases are clear. No pleural
effusion.

Hepatobiliary: Fatty deposition adjacent to the falciform ligament.
Liver is normal in size and contour. Gallbladder is unremarkable.
1.5 cm cyst posterior right hepatic lobe (image 28; series 3).

Pancreas: Unremarkable

Spleen: Unremarkable

Adrenals/Urinary Tract: Normal adrenal glands. 2.7 cm exophytic cyst
interpolar region right kidney. Kidneys enhance symmetrically with
contrast. No enhancing renal mass. Urinary bladder is unremarkable.

Stomach/Bowel: Normal morphology of the stomach. No abnormal bowel
wall thickening or evidence for bowel obstruction. No free fluid or
free intraperitoneal air.

Vascular/Lymphatic: Normal caliber abdominal aorta. No
retroperitoneal lymphadenopathy.

Reproductive: There is a 5.0 x 3.9 cm peripherally calcified fibroid
along the right aspect of the uterine fundus. Adnexal structures
unremarkable.

Other: Diastasis of the rectus abdominus musculature. Small fat
containing ventral scratch the small fat containing periumbilical
hernia (image 52; series 7).

Musculoskeletal: Lumbar spine degenerative changes. Patchy sclerosis
about the left SI joint.
IMPRESSION: Diastasis of the rectus abdominus musculature. Small fat containing
periumbilical hernia.

Large partially calcified fibroid within the right aspect of the
uterine body/fundus.

## 2020-12-03 DIAGNOSIS — M9902 Segmental and somatic dysfunction of thoracic region: Secondary | ICD-10-CM | POA: Diagnosis not present

## 2020-12-03 DIAGNOSIS — M9901 Segmental and somatic dysfunction of cervical region: Secondary | ICD-10-CM | POA: Diagnosis not present

## 2020-12-03 DIAGNOSIS — R519 Headache, unspecified: Secondary | ICD-10-CM | POA: Diagnosis not present

## 2020-12-03 DIAGNOSIS — M9903 Segmental and somatic dysfunction of lumbar region: Secondary | ICD-10-CM | POA: Diagnosis not present

## 2021-01-28 DIAGNOSIS — M9901 Segmental and somatic dysfunction of cervical region: Secondary | ICD-10-CM | POA: Diagnosis not present

## 2021-01-28 DIAGNOSIS — M9902 Segmental and somatic dysfunction of thoracic region: Secondary | ICD-10-CM | POA: Diagnosis not present

## 2021-01-28 DIAGNOSIS — R519 Headache, unspecified: Secondary | ICD-10-CM | POA: Diagnosis not present

## 2021-01-28 DIAGNOSIS — M9903 Segmental and somatic dysfunction of lumbar region: Secondary | ICD-10-CM | POA: Diagnosis not present

## 2021-02-14 DIAGNOSIS — R1033 Periumbilical pain: Secondary | ICD-10-CM | POA: Diagnosis not present

## 2021-02-14 DIAGNOSIS — M6208 Separation of muscle (nontraumatic), other site: Secondary | ICD-10-CM | POA: Diagnosis not present

## 2021-02-15 DIAGNOSIS — M9903 Segmental and somatic dysfunction of lumbar region: Secondary | ICD-10-CM | POA: Diagnosis not present

## 2021-02-15 DIAGNOSIS — M9902 Segmental and somatic dysfunction of thoracic region: Secondary | ICD-10-CM | POA: Diagnosis not present

## 2021-02-15 DIAGNOSIS — R519 Headache, unspecified: Secondary | ICD-10-CM | POA: Diagnosis not present

## 2021-02-15 DIAGNOSIS — M9901 Segmental and somatic dysfunction of cervical region: Secondary | ICD-10-CM | POA: Diagnosis not present

## 2021-03-01 DIAGNOSIS — M9901 Segmental and somatic dysfunction of cervical region: Secondary | ICD-10-CM | POA: Diagnosis not present

## 2021-03-01 DIAGNOSIS — R519 Headache, unspecified: Secondary | ICD-10-CM | POA: Diagnosis not present

## 2021-03-01 DIAGNOSIS — M9902 Segmental and somatic dysfunction of thoracic region: Secondary | ICD-10-CM | POA: Diagnosis not present

## 2021-03-01 DIAGNOSIS — M9903 Segmental and somatic dysfunction of lumbar region: Secondary | ICD-10-CM | POA: Diagnosis not present

## 2021-03-02 DIAGNOSIS — Z1231 Encounter for screening mammogram for malignant neoplasm of breast: Secondary | ICD-10-CM | POA: Diagnosis not present

## 2021-03-23 DIAGNOSIS — D649 Anemia, unspecified: Secondary | ICD-10-CM | POA: Diagnosis not present

## 2021-03-23 DIAGNOSIS — Z0000 Encounter for general adult medical examination without abnormal findings: Secondary | ICD-10-CM | POA: Diagnosis not present

## 2021-03-23 DIAGNOSIS — Z13220 Encounter for screening for lipoid disorders: Secondary | ICD-10-CM | POA: Diagnosis not present

## 2021-03-23 DIAGNOSIS — R109 Unspecified abdominal pain: Secondary | ICD-10-CM | POA: Diagnosis not present

## 2021-03-29 ENCOUNTER — Other Ambulatory Visit: Payer: Self-pay | Admitting: Family Medicine

## 2021-03-29 DIAGNOSIS — R1033 Periumbilical pain: Secondary | ICD-10-CM

## 2021-04-14 DIAGNOSIS — M9903 Segmental and somatic dysfunction of lumbar region: Secondary | ICD-10-CM | POA: Diagnosis not present

## 2021-04-14 DIAGNOSIS — R519 Headache, unspecified: Secondary | ICD-10-CM | POA: Diagnosis not present

## 2021-04-14 DIAGNOSIS — M9901 Segmental and somatic dysfunction of cervical region: Secondary | ICD-10-CM | POA: Diagnosis not present

## 2021-04-14 DIAGNOSIS — M9902 Segmental and somatic dysfunction of thoracic region: Secondary | ICD-10-CM | POA: Diagnosis not present

## 2021-04-15 ENCOUNTER — Other Ambulatory Visit: Payer: Self-pay

## 2021-04-15 ENCOUNTER — Ambulatory Visit
Admission: RE | Admit: 2021-04-15 | Discharge: 2021-04-15 | Disposition: A | Discharge status: Home / Self Care | Payer: BLUE CROSS/BLUE SHIELD | Source: Ambulatory Visit | Attending: Family Medicine | Admitting: Family Medicine

## 2021-04-15 DIAGNOSIS — K6389 Other specified diseases of intestine: Secondary | ICD-10-CM | POA: Diagnosis not present

## 2021-04-15 DIAGNOSIS — K429 Umbilical hernia without obstruction or gangrene: Secondary | ICD-10-CM | POA: Diagnosis not present

## 2021-04-15 DIAGNOSIS — M419 Scoliosis, unspecified: Secondary | ICD-10-CM | POA: Diagnosis not present

## 2021-04-15 DIAGNOSIS — R1033 Periumbilical pain: Secondary | ICD-10-CM

## 2021-04-15 DIAGNOSIS — K3189 Other diseases of stomach and duodenum: Secondary | ICD-10-CM | POA: Diagnosis not present

## 2021-04-15 MED ORDER — IOPAMIDOL (ISOVUE-300) INJECTION 61%
100.0000 mL | Freq: Once | INTRAVENOUS | Status: AC | PRN
  Administered 2021-04-15: 100 mL via INTRAVENOUS

## 2021-05-26 DIAGNOSIS — R109 Unspecified abdominal pain: Secondary | ICD-10-CM | POA: Diagnosis not present

## 2021-05-26 DIAGNOSIS — Z01419 Encounter for gynecological examination (general) (routine) without abnormal findings: Secondary | ICD-10-CM | POA: Diagnosis not present

## 2021-05-26 DIAGNOSIS — D259 Leiomyoma of uterus, unspecified: Secondary | ICD-10-CM | POA: Diagnosis not present

## 2021-05-26 DIAGNOSIS — Z9851 Tubal ligation status: Secondary | ICD-10-CM | POA: Diagnosis not present

## 2021-06-07 DIAGNOSIS — D251 Intramural leiomyoma of uterus: Secondary | ICD-10-CM | POA: Diagnosis not present

## 2021-06-07 DIAGNOSIS — R102 Pelvic and perineal pain: Secondary | ICD-10-CM | POA: Diagnosis not present

## 2021-06-07 DIAGNOSIS — M9903 Segmental and somatic dysfunction of lumbar region: Secondary | ICD-10-CM | POA: Diagnosis not present

## 2021-06-07 DIAGNOSIS — M9901 Segmental and somatic dysfunction of cervical region: Secondary | ICD-10-CM | POA: Diagnosis not present

## 2021-06-07 DIAGNOSIS — M9902 Segmental and somatic dysfunction of thoracic region: Secondary | ICD-10-CM | POA: Diagnosis not present

## 2021-06-07 DIAGNOSIS — R519 Headache, unspecified: Secondary | ICD-10-CM | POA: Diagnosis not present

## 2021-07-04 DIAGNOSIS — R300 Dysuria: Secondary | ICD-10-CM | POA: Diagnosis not present

## 2021-08-15 DIAGNOSIS — M9901 Segmental and somatic dysfunction of cervical region: Secondary | ICD-10-CM | POA: Diagnosis not present

## 2021-08-15 DIAGNOSIS — M9902 Segmental and somatic dysfunction of thoracic region: Secondary | ICD-10-CM | POA: Diagnosis not present

## 2021-08-15 DIAGNOSIS — R519 Headache, unspecified: Secondary | ICD-10-CM | POA: Diagnosis not present

## 2021-08-15 DIAGNOSIS — M9903 Segmental and somatic dysfunction of lumbar region: Secondary | ICD-10-CM | POA: Diagnosis not present

## 2021-09-23 DIAGNOSIS — R8290 Unspecified abnormal findings in urine: Secondary | ICD-10-CM | POA: Diagnosis not present

## 2021-10-05 DIAGNOSIS — K5900 Constipation, unspecified: Secondary | ICD-10-CM | POA: Diagnosis not present

## 2021-10-05 DIAGNOSIS — M62838 Other muscle spasm: Secondary | ICD-10-CM | POA: Diagnosis not present

## 2021-10-05 DIAGNOSIS — R531 Weakness: Secondary | ICD-10-CM | POA: Diagnosis not present

## 2021-10-13 DIAGNOSIS — M62838 Other muscle spasm: Secondary | ICD-10-CM | POA: Diagnosis not present

## 2021-10-13 DIAGNOSIS — R531 Weakness: Secondary | ICD-10-CM | POA: Diagnosis not present

## 2021-10-13 DIAGNOSIS — K5900 Constipation, unspecified: Secondary | ICD-10-CM | POA: Diagnosis not present

## 2021-10-17 DIAGNOSIS — M9902 Segmental and somatic dysfunction of thoracic region: Secondary | ICD-10-CM | POA: Diagnosis not present

## 2021-10-17 DIAGNOSIS — M9901 Segmental and somatic dysfunction of cervical region: Secondary | ICD-10-CM | POA: Diagnosis not present

## 2021-10-17 DIAGNOSIS — M9903 Segmental and somatic dysfunction of lumbar region: Secondary | ICD-10-CM | POA: Diagnosis not present

## 2021-10-17 DIAGNOSIS — R519 Headache, unspecified: Secondary | ICD-10-CM | POA: Diagnosis not present

## 2021-10-19 DIAGNOSIS — R531 Weakness: Secondary | ICD-10-CM | POA: Diagnosis not present

## 2021-10-19 DIAGNOSIS — K5900 Constipation, unspecified: Secondary | ICD-10-CM | POA: Diagnosis not present

## 2021-10-19 DIAGNOSIS — M62838 Other muscle spasm: Secondary | ICD-10-CM | POA: Diagnosis not present

## 2021-10-26 DIAGNOSIS — R531 Weakness: Secondary | ICD-10-CM | POA: Diagnosis not present

## 2021-10-26 DIAGNOSIS — K5900 Constipation, unspecified: Secondary | ICD-10-CM | POA: Diagnosis not present

## 2021-10-26 DIAGNOSIS — M62838 Other muscle spasm: Secondary | ICD-10-CM | POA: Diagnosis not present

## 2021-12-18 DIAGNOSIS — R000 Tachycardia, unspecified: Secondary | ICD-10-CM | POA: Diagnosis not present

## 2021-12-18 DIAGNOSIS — J029 Acute pharyngitis, unspecified: Secondary | ICD-10-CM | POA: Diagnosis not present

## 2021-12-18 DIAGNOSIS — E860 Dehydration: Secondary | ICD-10-CM | POA: Diagnosis not present

## 2021-12-20 DIAGNOSIS — R002 Palpitations: Secondary | ICD-10-CM | POA: Diagnosis not present

## 2021-12-20 DIAGNOSIS — D649 Anemia, unspecified: Secondary | ICD-10-CM | POA: Diagnosis not present

## 2021-12-20 DIAGNOSIS — R6883 Chills (without fever): Secondary | ICD-10-CM | POA: Diagnosis not present

## 2022-01-05 DIAGNOSIS — R82998 Other abnormal findings in urine: Secondary | ICD-10-CM | POA: Diagnosis not present

## 2022-01-18 DIAGNOSIS — M9901 Segmental and somatic dysfunction of cervical region: Secondary | ICD-10-CM | POA: Diagnosis not present

## 2022-01-18 DIAGNOSIS — R519 Headache, unspecified: Secondary | ICD-10-CM | POA: Diagnosis not present

## 2022-01-18 DIAGNOSIS — M9902 Segmental and somatic dysfunction of thoracic region: Secondary | ICD-10-CM | POA: Diagnosis not present

## 2022-01-18 DIAGNOSIS — M9903 Segmental and somatic dysfunction of lumbar region: Secondary | ICD-10-CM | POA: Diagnosis not present

## 2022-02-23 DIAGNOSIS — R519 Headache, unspecified: Secondary | ICD-10-CM | POA: Diagnosis not present

## 2022-02-23 DIAGNOSIS — M9901 Segmental and somatic dysfunction of cervical region: Secondary | ICD-10-CM | POA: Diagnosis not present

## 2022-02-23 DIAGNOSIS — M9902 Segmental and somatic dysfunction of thoracic region: Secondary | ICD-10-CM | POA: Diagnosis not present

## 2022-02-23 DIAGNOSIS — M9903 Segmental and somatic dysfunction of lumbar region: Secondary | ICD-10-CM | POA: Diagnosis not present

## 2022-04-18 DIAGNOSIS — E7800 Pure hypercholesterolemia, unspecified: Secondary | ICD-10-CM | POA: Diagnosis not present

## 2022-04-18 DIAGNOSIS — D72829 Elevated white blood cell count, unspecified: Secondary | ICD-10-CM | POA: Diagnosis not present

## 2022-04-18 DIAGNOSIS — Z0000 Encounter for general adult medical examination without abnormal findings: Secondary | ICD-10-CM | POA: Diagnosis not present

## 2022-06-05 DIAGNOSIS — Z114 Encounter for screening for human immunodeficiency virus [HIV]: Secondary | ICD-10-CM | POA: Diagnosis not present

## 2022-06-05 DIAGNOSIS — Z01419 Encounter for gynecological examination (general) (routine) without abnormal findings: Secondary | ICD-10-CM | POA: Diagnosis not present

## 2022-06-05 DIAGNOSIS — Z113 Encounter for screening for infections with a predominantly sexual mode of transmission: Secondary | ICD-10-CM | POA: Diagnosis not present

## 2022-06-05 DIAGNOSIS — Z9851 Tubal ligation status: Secondary | ICD-10-CM | POA: Diagnosis not present

## 2022-06-05 DIAGNOSIS — Z1159 Encounter for screening for other viral diseases: Secondary | ICD-10-CM | POA: Diagnosis not present

## 2022-06-07 DIAGNOSIS — Z1231 Encounter for screening mammogram for malignant neoplasm of breast: Secondary | ICD-10-CM | POA: Diagnosis not present

## 2022-06-16 DIAGNOSIS — N6489 Other specified disorders of breast: Secondary | ICD-10-CM | POA: Diagnosis not present

## 2022-06-16 DIAGNOSIS — R922 Inconclusive mammogram: Secondary | ICD-10-CM | POA: Diagnosis not present

## 2022-06-20 ENCOUNTER — Other Ambulatory Visit: Payer: Self-pay

## 2022-06-20 DIAGNOSIS — N6332 Unspecified lump in axillary tail of the left breast: Secondary | ICD-10-CM | POA: Diagnosis not present

## 2022-06-20 DIAGNOSIS — C8594 Non-Hodgkin lymphoma, unspecified, lymph nodes of axilla and upper limb: Secondary | ICD-10-CM | POA: Diagnosis not present

## 2022-07-12 ENCOUNTER — Ambulatory Visit: Payer: BLUE CROSS/BLUE SHIELD | Admitting: Internal Medicine

## 2022-07-12 ENCOUNTER — Encounter: Payer: Self-pay | Admitting: Internal Medicine

## 2022-07-12 ENCOUNTER — Telehealth: Payer: Self-pay | Admitting: Internal Medicine

## 2022-07-12 VITALS — BP 116/77 | HR 77 | Ht 65.0 in | Wt 194.4 lb

## 2022-07-12 DIAGNOSIS — E7801 Familial hypercholesterolemia: Secondary | ICD-10-CM

## 2022-07-12 NOTE — Progress Notes (Signed)
LIPID CLINIC CONSULT NOTE  Chief Complaint:  High cholesterol  Primary Care Physician: Glenis Smoker, MD  Primary Cardiologist:  None  HPI:  Marcille Barman is a 41 y.o. female who is being seen today for the evaluation of high cholesterol at the request of Glenis Smoker, *.  This is a pleasant 41 year old female who is referred for evaluation management of high cholesterol.  She reports longstanding history of high cholesterol since at least her 32s.  She says that she was never offered therapy for this.  She notes that her family history is significant for high cholesterol in her sister and her father who is cholesterol was in the 300s but he reportedly does not take a statin medicine.  He does take red yeast rice.  Despite the high cholesterol in her family, she is not aware of any early onset heart disease.  She does not have any personal history of heart disease.  Recent lipid profile showed total cholesterol of 261, triglycerides 95, HDL 51 and LDL 193.  She takes no medicines.  She says she exercises daily.  She eats a primarily vegetarian diet.  She tries to reduce saturated fats.  She eats very little fried food.  PMHx:  Past Medical History:  Diagnosis Date   AMA (advanced maternal age) primigravida 35+    Fibroid    Hx of varicella    Obese    Postpartum care following cesarean delivery (5/14) 04/09/2016   Uterine scar from previous surgery affecting pregnancy 04/09/2016   Vaginal Pap smear, abnormal     Past Surgical History:  Procedure Laterality Date   CESAREAN SECTION N/A 04/09/2016   Procedure: CESAREAN SECTION;  Surgeon: Servando Salina, MD;  Location: [facility];  Service: Obstetrics;  Laterality: N/A;   CESAREAN SECTION WITH BILATERAL TUBAL LIGATION N/A 07/31/2017   Procedure: CESAREAN SECTION WITH BILATERAL TUBAL LIGATION;  Surgeon: Brien Few, MD;  Location: [facility];  Service: Obstetrics;  Laterality: N/A;  EDD:  08/18/17 Allergy: Latex, Soybean Oil   CRYOTHERAPY     LAPAROSCOPY     MYOMECTOMY     POLYPECTOMY     TONSILLECTOMY     WISDOM TOOTH EXTRACTION      FAMHx:  Family History  Problem Relation Age of Onset   Hypertension Mother    GER disease Mother    Hypertension Father    Crohn's disease Brother    Hypertension Brother    Diabetes Maternal Grandmother    Cancer Maternal Grandfather    Uterine cancer Paternal Grandmother    Cancer Paternal Grandfather     SOCHx:   reports that she has never smoked. She has never used smokeless tobacco. She reports that she does not drink alcohol and does not use drugs.  ALLERGIES:  Allergies  Allergen Reactions   Latex Itching    Irritation    Soybean-Containing Drug Products Hives and Itching    ROS: Pertinent items noted in HPI and remainder of comprehensive ROS otherwise negative.  HOME MEDS: No current outpatient medications on file prior to visit.   No current facility-administered medications on file prior to visit.    LABS/IMAGING: No results found for this or any previous visit (from the past 48 hour(s)). No results found.  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   Ms. Wynn Banker has probable familial hyperlipidemia based on Baruch Merl criteria but has no early onset heart disease in the family.  She exercises regularly and eats very healthy.  She reportedly has not been offered prior  therapy for her dyslipidemia although per guidelines it is recommended that she has 50% reduction in her LDL cholesterol.  That being said I think we should look further into whether or not she has developed any pathologic findings related to her high LDL and would recommend a coronary calcium score and we will pursue genetic testing to determine whether or not she may have a pathogenic inherited cholesterol disorder.  I will contact her with those results and will plan shared decision making as to therapy going further.  Thanks again for the kind referral.  Pixie Casino, MD, FACC, [place] Director of the Advanced Lipid Disorders &  Cardiovascular Risk Reduction Clinic Diplomate of the American Board of Clinical Lipidology Attending Cardiologist  Direct Dial: [phone]  Fax: [phone]  Website:  [email or web address] 07/12/2022, 11:01 AM

## 2022-07-12 NOTE — Telephone Encounter (Signed)
Genetic test for dyslipidemia/ASCVD ordered ([facility]) Cheek swab completed in office Specimen and necessary paperwork mailed. ID: [id number]

## 2022-07-12 NOTE — Patient Instructions (Signed)
Medication Instructions:  Your physician recommends that you continue on your current medications as directed. Please refer to the Current Medication list given to you today.  *If you need a refill on your cardiac medications before your next appointment, please call your pharmacy*  Testing/Procedures: Dr. Debara Pickett has ordered a CT coronary calcium score.   Test locations:  [place]   This is $99 out of pocket.   Coronary CalciumScan A coronary calcium scan is an imaging test used to look for deposits of calcium and other fatty materials (plaques) in the inner lining of the blood vessels of the heart (coronary arteries). These deposits of calcium and plaques can partly clog and narrow the coronary arteries without producing any symptoms or warning signs. This puts a person at risk for a heart attack. This test can detect these deposits before symptoms develop. Tell a health care provider about: Any allergies you have. All medicines you are taking, including vitamins, herbs, eye drops, creams, and over-the-counter medicines. Any problems you or family members have had with anesthetic medicines. Any blood disorders you have. Any surgeries you have had. Any medical conditions you have. Whether you are pregnant or may be pregnant. What are the risks? Generally, this is a safe procedure. However, problems may occur, including: Harm to a pregnant woman and her unborn baby. This test involves the use of radiation. Radiation exposure can be dangerous to a pregnant woman and her unborn baby. If you are pregnant, you generally should not have this procedure done. Slight increase in the risk of cancer. This is because of the radiation involved in the test. What happens before the procedure? No preparation is needed for this procedure. What happens during the procedure? You will undress and remove any jewelry around your neck or chest. You will put on a hospital  gown. Sticky electrodes will be placed on your chest. The electrodes will be connected to an electrocardiogram (ECG) machine to record a tracing of the electrical activity of your heart. A CT scanner will take pictures of your heart. During this time, you will be asked to lie still and hold your breath for 2-3 seconds while a picture of your heart is being taken. The procedure may vary among health care providers and hospitals. What happens after the procedure? You can get dressed. You can return to your normal activities. It is up to you to get the results of your test. Ask your health care provider, or the department that is doing the test, when your results will be ready. Summary A coronary calcium scan is an imaging test used to look for deposits of calcium and other fatty materials (plaques) in the inner lining of the blood vessels of the heart (coronary arteries). Generally, this is a safe procedure. Tell your health care provider if you are pregnant or may be pregnant. No preparation is needed for this procedure. A CT scanner will take pictures of your heart. You can return to your normal activities after the scan is done. This information is not intended to replace advice given to you by your health care provider. Make sure you discuss any questions you have with your health care provider. Document Released: 05/11/2008 Document Revised: 10/02/2016 Document Reviewed: 10/02/2016 Elsevier Interactive Patient Education  2017 [place]: At [facility], you and your health needs are our priority.  As part of our continuing mission to provide you with exceptional heart care, we have created designated Provider  Care Teams.  These Care Teams include your primary Cardiologist (physician) and Advanced Practice Providers (APPs -  Physician Assistants and Nurse Practitioners) who all work together to provide you with the care you need, when you need it.  We recommend signing up  for the patient portal called "MyChart".  Sign up information is provided on this After Visit Summary.  MyChart is used to connect with patients for Virtual Visits (Telemedicine).  Patients are able to view lab/test results, encounter notes, upcoming appointments, etc.  Non-urgent messages can be sent to your provider as well.   To learn more about what you can do with MyChart, go to [email or web address].    Your next appointment:    AS NEEDED with Dr. Debara Pickett

## 2022-07-25 ENCOUNTER — Encounter: Payer: Self-pay | Admitting: Internal Medicine

## 2022-07-28 ENCOUNTER — Encounter: Payer: Self-pay | Admitting: Internal Medicine

## 2022-08-09 ENCOUNTER — Ambulatory Visit (HOSPITAL_BASED_OUTPATIENT_CLINIC_OR_DEPARTMENT_OTHER)
Admission: RE | Admit: 2022-08-09 | Discharge: 2022-08-09 | Disposition: A | Discharge status: Home / Self Care | Payer: BLUE CROSS/BLUE SHIELD | Source: Ambulatory Visit | Attending: Internal Medicine | Admitting: Internal Medicine

## 2022-08-09 DIAGNOSIS — E7801 Familial hypercholesterolemia: Secondary | ICD-10-CM | POA: Insufficient documentation

## 2022-08-14 ENCOUNTER — Other Ambulatory Visit: Payer: Self-pay | Admitting: *Deleted

## 2022-08-14 DIAGNOSIS — J9859 Other diseases of mediastinum, not elsewhere classified: Secondary | ICD-10-CM

## 2022-09-07 DIAGNOSIS — Z1152 Encounter for screening for COVID-19: Secondary | ICD-10-CM | POA: Diagnosis not present

## 2022-09-07 DIAGNOSIS — J029 Acute pharyngitis, unspecified: Secondary | ICD-10-CM | POA: Diagnosis not present

## 2022-10-23 DIAGNOSIS — K5900 Constipation, unspecified: Secondary | ICD-10-CM | POA: Diagnosis not present

## 2022-10-23 DIAGNOSIS — R1310 Dysphagia, unspecified: Secondary | ICD-10-CM | POA: Diagnosis not present

## 2022-11-02 DIAGNOSIS — M9901 Segmental and somatic dysfunction of cervical region: Secondary | ICD-10-CM | POA: Diagnosis not present

## 2022-11-02 DIAGNOSIS — M9903 Segmental and somatic dysfunction of lumbar region: Secondary | ICD-10-CM | POA: Diagnosis not present

## 2022-11-02 DIAGNOSIS — M9902 Segmental and somatic dysfunction of thoracic region: Secondary | ICD-10-CM | POA: Diagnosis not present

## 2022-11-02 DIAGNOSIS — R519 Headache, unspecified: Secondary | ICD-10-CM | POA: Diagnosis not present

## 2022-11-09 DIAGNOSIS — M9902 Segmental and somatic dysfunction of thoracic region: Secondary | ICD-10-CM | POA: Diagnosis not present

## 2022-11-09 DIAGNOSIS — R519 Headache, unspecified: Secondary | ICD-10-CM | POA: Diagnosis not present

## 2022-11-09 DIAGNOSIS — M9901 Segmental and somatic dysfunction of cervical region: Secondary | ICD-10-CM | POA: Diagnosis not present

## 2022-11-09 DIAGNOSIS — M62830 Muscle spasm of back: Secondary | ICD-10-CM | POA: Diagnosis not present

## 2022-11-15 DIAGNOSIS — K219 Gastro-esophageal reflux disease without esophagitis: Secondary | ICD-10-CM | POA: Diagnosis not present

## 2022-11-15 DIAGNOSIS — K2970 Gastritis, unspecified, without bleeding: Secondary | ICD-10-CM | POA: Diagnosis not present

## 2022-11-15 DIAGNOSIS — R1310 Dysphagia, unspecified: Secondary | ICD-10-CM | POA: Diagnosis not present

## 2022-11-15 DIAGNOSIS — K2930 Chronic superficial gastritis without bleeding: Secondary | ICD-10-CM | POA: Diagnosis not present

## 2022-11-15 DIAGNOSIS — K222 Esophageal obstruction: Secondary | ICD-10-CM | POA: Diagnosis not present

## 2022-11-15 DIAGNOSIS — K3189 Other diseases of stomach and duodenum: Secondary | ICD-10-CM | POA: Diagnosis not present

## 2022-11-15 DIAGNOSIS — K2289 Other specified disease of esophagus: Secondary | ICD-10-CM | POA: Diagnosis not present

## 2022-11-15 DIAGNOSIS — K200 Eosinophilic esophagitis: Secondary | ICD-10-CM | POA: Diagnosis not present

## 2023-01-01 DIAGNOSIS — R519 Headache, unspecified: Secondary | ICD-10-CM | POA: Diagnosis not present

## 2023-01-01 DIAGNOSIS — M9901 Segmental and somatic dysfunction of cervical region: Secondary | ICD-10-CM | POA: Diagnosis not present

## 2023-01-01 DIAGNOSIS — M62830 Muscle spasm of back: Secondary | ICD-10-CM | POA: Diagnosis not present

## 2023-01-01 DIAGNOSIS — M9902 Segmental and somatic dysfunction of thoracic region: Secondary | ICD-10-CM | POA: Diagnosis not present

## 2023-01-23 DIAGNOSIS — R519 Headache, unspecified: Secondary | ICD-10-CM | POA: Diagnosis not present

## 2023-01-23 DIAGNOSIS — M9902 Segmental and somatic dysfunction of thoracic region: Secondary | ICD-10-CM | POA: Diagnosis not present

## 2023-01-23 DIAGNOSIS — M9901 Segmental and somatic dysfunction of cervical region: Secondary | ICD-10-CM | POA: Diagnosis not present

## 2023-01-23 DIAGNOSIS — M62830 Muscle spasm of back: Secondary | ICD-10-CM | POA: Diagnosis not present

## 2023-02-18 ENCOUNTER — Ambulatory Visit (HOSPITAL_BASED_OUTPATIENT_CLINIC_OR_DEPARTMENT_OTHER): Payer: BLUE CROSS/BLUE SHIELD

## 2023-03-04 ENCOUNTER — Ambulatory Visit (HOSPITAL_BASED_OUTPATIENT_CLINIC_OR_DEPARTMENT_OTHER): Payer: BLUE CROSS/BLUE SHIELD

## 2023-06-11 DIAGNOSIS — D2371 Other benign neoplasm of skin of right lower limb, including hip: Secondary | ICD-10-CM | POA: Diagnosis not present

## 2023-06-11 DIAGNOSIS — L728 Other follicular cysts of the skin and subcutaneous tissue: Secondary | ICD-10-CM | POA: Diagnosis not present

## 2023-06-21 DIAGNOSIS — R519 Headache, unspecified: Secondary | ICD-10-CM | POA: Diagnosis not present

## 2023-06-21 DIAGNOSIS — M9901 Segmental and somatic dysfunction of cervical region: Secondary | ICD-10-CM | POA: Diagnosis not present

## 2023-06-21 DIAGNOSIS — M9902 Segmental and somatic dysfunction of thoracic region: Secondary | ICD-10-CM | POA: Diagnosis not present

## 2023-06-21 DIAGNOSIS — M62830 Muscle spasm of back: Secondary | ICD-10-CM | POA: Diagnosis not present

## 2023-07-23 DIAGNOSIS — D492 Neoplasm of unspecified behavior of bone, soft tissue, and skin: Secondary | ICD-10-CM | POA: Diagnosis not present

## 2023-07-23 DIAGNOSIS — L723 Sebaceous cyst: Secondary | ICD-10-CM | POA: Diagnosis not present

## 2023-08-07 ENCOUNTER — Other Ambulatory Visit: Payer: Self-pay | Admitting: Family Medicine

## 2023-08-07 DIAGNOSIS — J9859 Other diseases of mediastinum, not elsewhere classified: Secondary | ICD-10-CM

## 2023-08-07 DIAGNOSIS — E7800 Pure hypercholesterolemia, unspecified: Secondary | ICD-10-CM | POA: Diagnosis not present

## 2023-08-07 DIAGNOSIS — K200 Eosinophilic esophagitis: Secondary | ICD-10-CM | POA: Diagnosis not present

## 2023-08-07 DIAGNOSIS — Z0000 Encounter for general adult medical examination without abnormal findings: Secondary | ICD-10-CM | POA: Diagnosis not present

## 2023-08-07 DIAGNOSIS — Z79899 Other long term (current) drug therapy: Secondary | ICD-10-CM | POA: Diagnosis not present

## 2023-08-10 DIAGNOSIS — M9902 Segmental and somatic dysfunction of thoracic region: Secondary | ICD-10-CM | POA: Diagnosis not present

## 2023-08-10 DIAGNOSIS — M9903 Segmental and somatic dysfunction of lumbar region: Secondary | ICD-10-CM | POA: Diagnosis not present

## 2023-08-10 DIAGNOSIS — M9901 Segmental and somatic dysfunction of cervical region: Secondary | ICD-10-CM | POA: Diagnosis not present

## 2023-08-10 DIAGNOSIS — R519 Headache, unspecified: Secondary | ICD-10-CM | POA: Diagnosis not present

## 2023-08-25 DIAGNOSIS — R300 Dysuria: Secondary | ICD-10-CM | POA: Diagnosis not present

## 2023-08-27 ENCOUNTER — Ambulatory Visit
Admission: RE | Admit: 2023-08-27 | Discharge: 2023-08-27 | Disposition: A | Discharge status: Home / Self Care | Payer: BLUE CROSS/BLUE SHIELD | Source: Ambulatory Visit | Attending: Family Medicine

## 2023-08-27 DIAGNOSIS — J9859 Other diseases of mediastinum, not elsewhere classified: Secondary | ICD-10-CM

## 2023-08-27 DIAGNOSIS — M799 Soft tissue disorder, unspecified: Secondary | ICD-10-CM | POA: Diagnosis not present

## 2023-08-27 MED ORDER — IOPAMIDOL (ISOVUE-300) INJECTION 61%
500.0000 mL | Freq: Once | INTRAVENOUS | Status: AC | PRN
  Administered 2023-08-27: 75 mL via INTRAVENOUS

## 2023-09-17 DIAGNOSIS — Z1231 Encounter for screening mammogram for malignant neoplasm of breast: Secondary | ICD-10-CM | POA: Diagnosis not present

## 2023-09-25 DIAGNOSIS — Z114 Encounter for screening for human immunodeficiency virus [HIV]: Secondary | ICD-10-CM | POA: Diagnosis not present

## 2023-09-25 DIAGNOSIS — Z01419 Encounter for gynecological examination (general) (routine) without abnormal findings: Secondary | ICD-10-CM | POA: Diagnosis not present

## 2023-09-25 DIAGNOSIS — Z1159 Encounter for screening for other viral diseases: Secondary | ICD-10-CM | POA: Diagnosis not present

## 2023-09-25 DIAGNOSIS — Z113 Encounter for screening for infections with a predominantly sexual mode of transmission: Secondary | ICD-10-CM | POA: Diagnosis not present

## 2023-10-02 DIAGNOSIS — R519 Headache, unspecified: Secondary | ICD-10-CM | POA: Diagnosis not present

## 2023-10-02 DIAGNOSIS — M9901 Segmental and somatic dysfunction of cervical region: Secondary | ICD-10-CM | POA: Diagnosis not present

## 2023-10-02 DIAGNOSIS — M9903 Segmental and somatic dysfunction of lumbar region: Secondary | ICD-10-CM | POA: Diagnosis not present

## 2023-10-02 DIAGNOSIS — M9902 Segmental and somatic dysfunction of thoracic region: Secondary | ICD-10-CM | POA: Diagnosis not present

## 2023-10-15 DIAGNOSIS — N951 Menopausal and female climacteric states: Secondary | ICD-10-CM | POA: Diagnosis not present

## 2023-10-15 DIAGNOSIS — Z1329 Encounter for screening for other suspected endocrine disorder: Secondary | ICD-10-CM | POA: Diagnosis not present

## 2023-10-22 DIAGNOSIS — E785 Hyperlipidemia, unspecified: Secondary | ICD-10-CM | POA: Diagnosis not present

## 2023-10-22 DIAGNOSIS — E039 Hypothyroidism, unspecified: Secondary | ICD-10-CM | POA: Diagnosis not present

## 2023-10-22 DIAGNOSIS — N951 Menopausal and female climacteric states: Secondary | ICD-10-CM | POA: Diagnosis not present

## 2023-10-22 DIAGNOSIS — R635 Abnormal weight gain: Secondary | ICD-10-CM | POA: Diagnosis not present

## 2023-10-31 DIAGNOSIS — M9902 Segmental and somatic dysfunction of thoracic region: Secondary | ICD-10-CM | POA: Diagnosis not present

## 2023-10-31 DIAGNOSIS — M9901 Segmental and somatic dysfunction of cervical region: Secondary | ICD-10-CM | POA: Diagnosis not present

## 2023-10-31 DIAGNOSIS — M9903 Segmental and somatic dysfunction of lumbar region: Secondary | ICD-10-CM | POA: Diagnosis not present

## 2023-10-31 DIAGNOSIS — R519 Headache, unspecified: Secondary | ICD-10-CM | POA: Diagnosis not present

## 2023-11-06 DIAGNOSIS — R635 Abnormal weight gain: Secondary | ICD-10-CM | POA: Diagnosis not present

## 2023-11-06 DIAGNOSIS — E039 Hypothyroidism, unspecified: Secondary | ICD-10-CM | POA: Diagnosis not present

## 2023-11-06 DIAGNOSIS — R7303 Prediabetes: Secondary | ICD-10-CM | POA: Diagnosis not present

## 2023-11-06 DIAGNOSIS — D649 Anemia, unspecified: Secondary | ICD-10-CM | POA: Diagnosis not present

## 2023-11-06 DIAGNOSIS — E785 Hyperlipidemia, unspecified: Secondary | ICD-10-CM | POA: Diagnosis not present

## 2023-11-14 DIAGNOSIS — M9903 Segmental and somatic dysfunction of lumbar region: Secondary | ICD-10-CM | POA: Diagnosis not present

## 2023-11-14 DIAGNOSIS — M9901 Segmental and somatic dysfunction of cervical region: Secondary | ICD-10-CM | POA: Diagnosis not present

## 2023-11-14 DIAGNOSIS — R519 Headache, unspecified: Secondary | ICD-10-CM | POA: Diagnosis not present

## 2023-11-14 DIAGNOSIS — M9902 Segmental and somatic dysfunction of thoracic region: Secondary | ICD-10-CM | POA: Diagnosis not present

## 2023-11-15 DIAGNOSIS — E039 Hypothyroidism, unspecified: Secondary | ICD-10-CM | POA: Diagnosis not present

## 2023-11-15 DIAGNOSIS — R7303 Prediabetes: Secondary | ICD-10-CM | POA: Diagnosis not present

## 2023-11-15 DIAGNOSIS — E785 Hyperlipidemia, unspecified: Secondary | ICD-10-CM | POA: Diagnosis not present

## 2023-11-15 DIAGNOSIS — N951 Menopausal and female climacteric states: Secondary | ICD-10-CM | POA: Diagnosis not present

## 2023-12-06 ENCOUNTER — Ambulatory Visit: Payer: BLUE CROSS/BLUE SHIELD | Attending: Internal Medicine | Admitting: Internal Medicine

## 2023-12-06 ENCOUNTER — Encounter: Payer: Self-pay | Admitting: Internal Medicine

## 2023-12-06 VITALS — BP 105/74 | HR 75 | Ht 65.0 in | Wt 202.6 lb

## 2023-12-06 DIAGNOSIS — E7801 Familial hypercholesterolemia: Secondary | ICD-10-CM | POA: Diagnosis not present

## 2023-12-06 DIAGNOSIS — R002 Palpitations: Secondary | ICD-10-CM

## 2023-12-06 NOTE — Progress Notes (Signed)
 LIPID CLINIC CONSULT NOTE  Chief Complaint:  Follow-up dyslipidemia, palpitations  Primary Care Physician: Chrystal Lamarr RAMAN, MD  Primary Cardiologist:  None  HPI:  [name] is a 43 y.o. female who is being seen today for the evaluation of high cholesterol at the request of Chrystal Lamarr RAMAN, *.  This is a pleasant 43 year old female who is referred for evaluation management of high cholesterol.  She reports longstanding history of high cholesterol since at least her 80s.  She says that she was never offered therapy for this.  She notes that her family history is significant for high cholesterol in her sister and her father who is cholesterol was in the 300s but he reportedly does not take a statin medicine.  He does take red yeast rice.  Despite the high cholesterol in her family, she is not aware of any early onset heart disease.  She does not have any personal history of heart disease.  Recent lipid profile showed total cholesterol of 261, triglycerides 95, HDL 51 and LDL 193.  She takes no medicines.  She says she exercises daily.  She eats a primarily vegetarian diet.  She tries to reduce saturated fats.  She eats very little fried food.  12/06/2023  [name] returns today for follow-up of dyslipidemia.  She had recent labs drawn from [facility] sky MD for which she is seeking medical care and particularly weight loss medications.  She recently had her first injection of a GLP-1 agonist.  Apparently after that she developed several days of palpitations which were intense but then ultimately resolved.  This was also associated with URI symptoms.  I think it is unlikely that was related to her injection but likely that the palpitations were related to a virus or what ever caused her upper respiratory symptoms.  Prior to that she did have lab work which showed total cholesterol 248, triglycerides 41, HDL 57 and LDL 183.  This is down an LDL of 200.  He notes she has been taking red yeast  rice.  She is hesitant to take a statin because of her research of the pharmaceutical industry.  She would prefer to take a natural product.  She also said she might be open to nonstatin options.  She is interested to see the effects of weight loss on her cholesterol.  Of note, she did have genetic testing which showed several variants of unknown significance including the APO E gene (E3/E4), LDL adapter protein, APO A5 and GC KR variants as well as a variant in the ABC A1 gene.  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  Fortunately she had no coronary calcification.  PMHx:  Past Medical History:  Diagnosis Date   AMA (advanced maternal age) primigravida 35+    Fibroid    Hx of varicella    Obese    Postpartum care following cesarean delivery (5/14) 04/09/2016   Uterine scar from previous surgery affecting pregnancy 04/09/2016   Vaginal Pap smear, abnormal     Past Surgical History:  Procedure Laterality Date   CESAREAN SECTION N/A 04/09/2016   Procedure: CESAREAN SECTION;  Surgeon: Dickie Carder, MD;  [facility];  Service: Obstetrics;  Laterality: N/A;   CESAREAN SECTION WITH BILATERAL TUBAL LIGATION N/A 07/31/2017   Procedure: CESAREAN SECTION WITH BILATERAL TUBAL LIGATION;  Surgeon: Gorge Ade, MD;  [facility];  Service: Obstetrics;  Laterality: N/A;  EDD: 08/18/17 Allergy: Latex, Soybean Oil   CRYOTHERAPY  LAPAROSCOPY     MYOMECTOMY     POLYPECTOMY     TONSILLECTOMY     WISDOM TOOTH EXTRACTION      FAMHx:  Family History  Problem Relation Age of Onset   Hypertension Mother    GER disease Mother    Hypertension Father    Crohn's disease Brother    Hypertension Brother    Diabetes Maternal Grandmother    Cancer Maternal Grandfather    Uterine cancer Paternal Grandmother    Cancer Paternal Grandfather     SOCHx:   reports that she has never smoked. She has never used smokeless tobacco. She reports that she does  not drink alcohol and does not use drugs.  ALLERGIES:  Allergies  Allergen Reactions   Latex Itching    Irritation    Soybean-Containing Drug Products Hives and Itching    ROS: Pertinent items noted in HPI and remainder of comprehensive ROS otherwise negative.  HOME MEDS: No current outpatient medications on file prior to visit.   No current facility-administered medications on file prior to visit.    LABS/IMAGING: No results found for this or any previous visit (from the past 48 hours). No results found.  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   [name] has had improvement in her LDL cholesterol on red yeast rice.  She is comfortable taking this and said she might consider other nonstatin alternatives.  She would like to work on weight loss.  She needed clearance from her primary care provider to continue with GLP-1 agonist and she had an episode of palpitations after an injection however I suspect this is related to viral illness.  She has not had any further issues with that.  I would recommend restarting therapy with a GLP-1 as per her primary physician.  I will plan follow-up with repeat lipids in about 6 months.  Based on that we could consider other nonstatin alternatives.   She should continue with the red yeast rice.  [name], MD, [facility], FACP  [place]  [facility]  Medical Director of the Advanced Lipid Disorders &  Cardiovascular Risk Reduction Clinic Diplomate of the American Board of Clinical Lipidology Attending Cardiologist  Direct Dial: [phone]  Fax: [phone]  Website:  [email or web address] Vinie BROCKS Ralphie Lovelady 12/06/2023, 9:02 AM

## 2023-12-06 NOTE — Patient Instructions (Signed)
 Medication Instructions:  NO CHANGES  *If you need a refill on your cardiac medications before your next appointment, please call your pharmacy*  Lab Work: FASTING NMR lipoprofile to check cholesterol -- complete about ONE WEEK before next appointment  Follow-Up: At [facility] you and your health needs are our priority.  As part of our continuing mission to provide you with exceptional heart care, we have created designated Provider Care Teams.  These Care Teams include your primary Cardiologist (physician) and Advanced Practice Providers (APPs -  Physician Assistants and Nurse Practitioners) who all work together to provide you with the care you need, when you need it.  We recommend signing up for the patient portal called MyChart.  Sign up information is provided on this After Visit Summary.  MyChart is used to connect with patients for Virtual Visits (Telemedicine).  Patients are able to view lab/test results, encounter notes, upcoming appointments, etc.  Non-urgent messages can be sent to your provider as well.   To learn more about what you can do with MyChart, go to [email or web address].    Your next appointment:   6 months with Dr. Mona  ** CALL in March for a July appointment  Other Instructions .  1st Floor: - Lobby - Registration  - Pharmacy  - Lab - Cafe  2nd Floor: - PV Lab - Diagnostic Testing (echo, CT, nuclear med)  3rd Floor: - Vacant  4th Floor: - TCTS (cardiothoracic surgery) - AFib Clinic - Structural Heart Clinic - Vascular Surgery  - Vascular Ultrasound  5th Floor: - HeartCare Cardiology (general and EP) - Clinical Pharmacy for coumadin, hypertension, lipid, weight-loss medications, and med management appointments    Valet parking services will be available as well.
